# Patient Record
Sex: Male | Born: 1950 | Race: Black or African American | Hispanic: No | Marital: Single | Smoking: Never smoker
Health system: Southern US, Community
[De-identification: ages and names within clinical notes are randomized; demographics above are authoritative.]

## PROBLEM LIST (undated history)

## (undated) DIAGNOSIS — E785 Hyperlipidemia, unspecified: Secondary | ICD-10-CM

## (undated) DIAGNOSIS — E119 Type 2 diabetes mellitus without complications: Secondary | ICD-10-CM

## (undated) DIAGNOSIS — I10 Essential (primary) hypertension: Secondary | ICD-10-CM

## (undated) DIAGNOSIS — R000 Tachycardia, unspecified: Secondary | ICD-10-CM

## (undated) DIAGNOSIS — H409 Unspecified glaucoma: Secondary | ICD-10-CM

## (undated) HISTORY — DX: Tachycardia, unspecified: R00.0

## (undated) HISTORY — PX: TUMOR REMOVAL: SHX12

## (undated) HISTORY — DX: Unspecified glaucoma: H40.9

## (undated) HISTORY — DX: Essential (primary) hypertension: I10

---

## 1999-10-19 ENCOUNTER — Emergency Department (HOSPITAL_COMMUNITY): Admission: EM | Admit: 1999-10-19 | Discharge: 1999-10-19 | Payer: Self-pay | Admitting: Emergency Medicine

## 1999-10-19 ENCOUNTER — Encounter: Payer: Self-pay | Admitting: Emergency Medicine

## 1999-10-25 ENCOUNTER — Ambulatory Visit (HOSPITAL_COMMUNITY): Admission: RE | Admit: 1999-10-25 | Discharge: 1999-10-25 | Payer: Self-pay | Admitting: Cardiology

## 2000-04-27 ENCOUNTER — Ambulatory Visit (HOSPITAL_COMMUNITY): Admission: RE | Admit: 2000-04-27 | Discharge: 2000-04-27 | Payer: Self-pay | Admitting: Cardiology

## 2000-05-13 ENCOUNTER — Encounter: Payer: Self-pay | Admitting: Cardiology

## 2000-05-13 ENCOUNTER — Ambulatory Visit (HOSPITAL_COMMUNITY): Admission: RE | Admit: 2000-05-13 | Discharge: 2000-05-13 | Payer: Self-pay | Admitting: Cardiology

## 2006-01-16 ENCOUNTER — Encounter (HOSPITAL_COMMUNITY): Admission: RE | Admit: 2006-01-16 | Discharge: 2006-01-16 | Payer: Self-pay | Admitting: Cardiology

## 2010-04-13 ENCOUNTER — Encounter: Payer: Self-pay | Admitting: Cardiology

## 2010-11-15 ENCOUNTER — Other Ambulatory Visit (HOSPITAL_COMMUNITY): Payer: Self-pay | Admitting: Cardiology

## 2010-12-06 ENCOUNTER — Other Ambulatory Visit (HOSPITAL_COMMUNITY): Payer: Self-pay

## 2011-01-15 ENCOUNTER — Other Ambulatory Visit (HOSPITAL_COMMUNITY): Payer: Self-pay | Admitting: Cardiovascular Disease

## 2011-01-21 ENCOUNTER — Other Ambulatory Visit (HOSPITAL_COMMUNITY): Payer: Self-pay

## 2014-06-28 ENCOUNTER — Other Ambulatory Visit: Payer: Self-pay | Admitting: Nurse Practitioner

## 2014-06-28 ENCOUNTER — Encounter (INDEPENDENT_AMBULATORY_CARE_PROVIDER_SITE_OTHER): Payer: BC Managed Care – PPO

## 2014-06-28 ENCOUNTER — Encounter: Payer: Self-pay | Admitting: *Deleted

## 2014-06-28 DIAGNOSIS — R002 Palpitations: Secondary | ICD-10-CM

## 2014-06-28 DIAGNOSIS — R55 Syncope and collapse: Secondary | ICD-10-CM

## 2014-06-28 NOTE — Progress Notes (Signed)
Patient ID: Joseph Walker, male   DOB: [birth year], 64 y.o.   MRN: [id number] [employer] 48 hour holter monitor applied to patient.

## 2019-05-26 ENCOUNTER — Ambulatory Visit: Payer: BC Managed Care – PPO

## 2019-05-30 ENCOUNTER — Ambulatory Visit: Payer: BC Managed Care – PPO | Attending: Internal Medicine

## 2019-05-30 ENCOUNTER — Ambulatory Visit: Payer: BC Managed Care – PPO

## 2019-05-30 DIAGNOSIS — Z23 Encounter for immunization: Secondary | ICD-10-CM | POA: Insufficient documentation

## 2019-05-30 NOTE — Progress Notes (Signed)
   Covid-19 Vaccination Clinic  Name:  Joseph Walker    MRN: [id number] DOB: 08-05-1950  05/30/2019  [name] was observed post Covid-19 immunization for 15 minutes without incident. He was provided with Vaccine Information Sheet and instruction to access the V-Safe system.   [name] was instructed to call 911 with any severe reactions post vaccine: Marland Kitchen Difficulty breathing  . Swelling of face and throat  . A fast heartbeat  . A bad rash all over body  . Dizziness and weakness   Immunizations Administered    Name Date Dose VIS Date Route   Pfizer COVID-19 Vaccine 05/30/2019 12:21 PM 0.3 mL 03/04/2019 Intramuscular   Manufacturer: [employer], [employer]   Lot: [id number]   NDC: [address]-6

## 2019-07-05 ENCOUNTER — Ambulatory Visit: Payer: BC Managed Care – PPO | Attending: Internal Medicine

## 2019-07-05 DIAGNOSIS — Z23 Encounter for immunization: Secondary | ICD-10-CM

## 2019-07-05 NOTE — Progress Notes (Signed)
   Covid-19 Vaccination Clinic  Name:  Joseph Walker    MRN: [id number] DOB: 02-19-1951  07/05/2019  [name] was observed post Covid-19 immunization for 15 minutes without incident. He was provided with Vaccine Information Sheet and instruction to access the V-Safe system.   [name] was instructed to call 911 with any severe reactions post vaccine: Marland Kitchen Difficulty breathing  . Swelling of face and throat  . A fast heartbeat  . A bad rash all over body  . Dizziness and weakness   Immunizations Administered    Name Date Dose VIS Date Route   Pfizer COVID-19 Vaccine 07/05/2019 12:23 PM 0.3 mL 03/04/2019 Intramuscular   Manufacturer: [employer], [employer]   Lot: [id number]   NDC: [address]-0

## 2019-09-19 DIAGNOSIS — I10 Essential (primary) hypertension: Secondary | ICD-10-CM | POA: Diagnosis not present

## 2019-09-19 DIAGNOSIS — E119 Type 2 diabetes mellitus without complications: Secondary | ICD-10-CM | POA: Diagnosis not present

## 2019-09-19 DIAGNOSIS — I471 Supraventricular tachycardia: Secondary | ICD-10-CM | POA: Diagnosis not present

## 2019-09-19 DIAGNOSIS — E785 Hyperlipidemia, unspecified: Secondary | ICD-10-CM | POA: Diagnosis not present

## 2019-11-17 DIAGNOSIS — E119 Type 2 diabetes mellitus without complications: Secondary | ICD-10-CM | POA: Diagnosis not present

## 2019-11-30 DIAGNOSIS — H25813 Combined forms of age-related cataract, bilateral: Secondary | ICD-10-CM | POA: Diagnosis not present

## 2019-11-30 DIAGNOSIS — E119 Type 2 diabetes mellitus without complications: Secondary | ICD-10-CM | POA: Diagnosis not present

## 2019-11-30 DIAGNOSIS — H11043 Peripheral pterygium, stationary, bilateral: Secondary | ICD-10-CM | POA: Diagnosis not present

## 2019-11-30 DIAGNOSIS — H40013 Open angle with borderline findings, low risk, bilateral: Secondary | ICD-10-CM | POA: Diagnosis not present

## 2020-07-04 ENCOUNTER — Ambulatory Visit (INDEPENDENT_AMBULATORY_CARE_PROVIDER_SITE_OTHER): Payer: Medicare Other

## 2020-07-04 ENCOUNTER — Other Ambulatory Visit: Payer: Self-pay

## 2020-07-04 ENCOUNTER — Ambulatory Visit: Payer: BC Managed Care – PPO | Admitting: Internal Medicine

## 2020-07-04 ENCOUNTER — Encounter: Payer: Self-pay | Admitting: Internal Medicine

## 2020-07-04 VITALS — BP 128/70 | HR 73 | Temp 99.6°F | Ht 68.75 in | Wt 168.2 lb

## 2020-07-04 DIAGNOSIS — Z0001 Encounter for general adult medical examination with abnormal findings: Secondary | ICD-10-CM

## 2020-07-04 DIAGNOSIS — I10 Essential (primary) hypertension: Secondary | ICD-10-CM

## 2020-07-04 DIAGNOSIS — K2100 Gastro-esophageal reflux disease with esophagitis, without bleeding: Secondary | ICD-10-CM

## 2020-07-04 DIAGNOSIS — R0789 Other chest pain: Secondary | ICD-10-CM

## 2020-07-04 DIAGNOSIS — E785 Hyperlipidemia, unspecified: Secondary | ICD-10-CM

## 2020-07-04 DIAGNOSIS — E139 Other specified diabetes mellitus without complications: Secondary | ICD-10-CM

## 2020-07-04 DIAGNOSIS — R739 Hyperglycemia, unspecified: Secondary | ICD-10-CM

## 2020-07-04 DIAGNOSIS — Z1211 Encounter for screening for malignant neoplasm of colon: Secondary | ICD-10-CM | POA: Insufficient documentation

## 2020-07-04 LAB — CBC WITH DIFFERENTIAL/PLATELET
Basophils Absolute: 0 10*3/uL (ref 0.0–0.1)
Basophils Relative: 1 % (ref 0.0–3.0)
Eosinophils Absolute: 0.2 10*3/uL (ref 0.0–0.7)
Eosinophils Relative: 3.7 % (ref 0.0–5.0)
HCT: 45.6 % (ref 39.0–52.0)
Hemoglobin: 15 g/dL (ref 13.0–17.0)
Lymphocytes Relative: 41.7 % (ref 12.0–46.0)
Lymphs Abs: 2 10*3/uL (ref 0.7–4.0)
MCHC: 32.9 g/dL (ref 30.0–36.0)
MCV: 78.1 fl (ref 78.0–100.0)
Monocytes Absolute: 0.5 10*3/uL (ref 0.1–1.0)
Monocytes Relative: 10.7 % (ref 3.0–12.0)
Neutro Abs: 2.1 10*3/uL (ref 1.4–7.7)
Neutrophils Relative %: 42.9 % — ABNORMAL LOW (ref 43.0–77.0)
Platelets: 220 10*3/uL (ref 150.0–400.0)
RBC: 5.84 Mil/uL — ABNORMAL HIGH (ref 4.22–5.81)
RDW: 13.8 % (ref 11.5–15.5)
WBC: 4.9 10*3/uL (ref 4.0–10.5)

## 2020-07-04 LAB — LIPID PANEL
Cholesterol: 231 mg/dL — ABNORMAL HIGH (ref 0–200)
HDL: 53.3 mg/dL (ref >39.00)
NonHDL: 177.21
Total CHOL/HDL Ratio: 4
Triglycerides: 321 mg/dL — ABNORMAL HIGH (ref 0.0–149.0)
VLDL: 64.2 mg/dL — ABNORMAL HIGH (ref 0.0–40.0)

## 2020-07-04 LAB — BASIC METABOLIC PANEL
BUN: 12 mg/dL (ref 6–23)
CO2: 27 mEq/L (ref 19–32)
Calcium: 9.8 mg/dL (ref 8.4–10.5)
Chloride: 101 mEq/L (ref 96–112)
Creatinine, Ser: 1.04 mg/dL (ref 0.40–1.50)
GFR: 73.75 mL/min (ref >60.00)
Glucose, Bld: 177 mg/dL — ABNORMAL HIGH (ref 70–99)
Potassium: 3.6 mEq/L (ref 3.5–5.1)
Sodium: 138 mEq/L (ref 135–145)

## 2020-07-04 LAB — HEPATIC FUNCTION PANEL
ALT: 12 U/L (ref 0–53)
AST: 18 U/L (ref 0–37)
Albumin: 4.2 g/dL (ref 3.5–5.2)
Alkaline Phosphatase: 68 U/L (ref 39–117)
Bilirubin, Direct: 0.1 mg/dL (ref 0.0–0.3)
Total Bilirubin: 0.7 mg/dL (ref 0.2–1.2)
Total Protein: 7.6 g/dL (ref 6.0–8.3)

## 2020-07-04 LAB — LDL CHOLESTEROL, DIRECT: Direct LDL: 140 mg/dL

## 2020-07-04 LAB — HEMOGLOBIN A1C: Hgb A1c MFr Bld: 9.1 % — ABNORMAL HIGH (ref 4.6–6.5)

## 2020-07-04 LAB — PSA: PSA: 2.25 ng/mL (ref 0.10–4.00)

## 2020-07-04 MED ORDER — ROSUVASTATIN CALCIUM 20 MG PO TABS: 20.0000 mg | ORAL_TABLET | Freq: Every day | ORAL | 1 refills | Status: DC

## 2020-07-04 MED ORDER — TOUJEO MAX SOLOSTAR 300 UNIT/ML ~~LOC~~ SOPN: 30.0000 [IU] | PEN_INJECTOR | Freq: Every day | SUBCUTANEOUS | 1 refills | Status: DC

## 2020-07-04 MED ORDER — INSULIN PEN NEEDLE 32G X 6 MM MISC: 1.0000 | Freq: Every day | 1 refills | Status: DC

## 2020-07-04 MED ORDER — FREESTYLE LIBRE 2 SENSOR MISC: 1.0000 | Freq: Every day | 5 refills | Status: DC

## 2020-07-04 MED ORDER — FREESTYLE LIBRE 2 READER DEVI: 1.0000 | Freq: Every day | 5 refills | Status: DC

## 2020-07-04 NOTE — Progress Notes (Signed)
Subjective:  Patient ID: Joseph Walker, male    DOB: 07-Aug-1950  Age: 70 y.o. MRN: [id number]  CC: New Patient (Initial Visit) (Check-up) and Annual Exam  This visit occurred during the SARS-CoV-2 public health emergency.  Safety protocols were in place, including screening questions prior to the visit, additional usage of staff PPE, and extensive cleaning of exam room while observing appropriate contact time as indicated for disinfecting solutions.    HPI QUARON DELACRUZ presents for a CPX and to establish.  He complains of a 60-month history of pain over his left anterior chest wall area.  He said he only feels the pain when he is in the bed.  He describes it as an achy sensation that wakes him up intermittently.  He denies cough, dyspnea on exertion, diaphoresis, dizziness, or lightheadedness.  He controls the pain with aspirin and Alka-Seltzer.  History Delynn has a past medical history of Glaucoma, Hypertension, and Tachycardia.   He has a past surgical history that includes Tumor removal (Left).   His family history includes Alzheimer's disease in his maternal grandfather, maternal grandmother, and mother; Cataracts in his mother; Glaucoma in his mother; Lung disease in his father.He reports that he has never smoked. He has never used smokeless tobacco. He reports current alcohol use of about 2.0 standard drinks of alcohol per week. He reports that he does not use drugs.  Outpatient Medications Prior to Visit  Medication Sig Dispense Refill  . aspirin 325 MG tablet Take 325 mg by mouth daily.    . Aspirin Effervescent (ALKA-SELTZER ORIGINAL PO) Take by mouth.    . diltiazem (DILTIAZEM CD) 240 MG 24 hr capsule Take 240 mg by mouth daily.    . hydrochlorothiazide (HYDRODIURIL) 12.5 MG tablet Take 12.5 mg by mouth daily.    Marland Kitchen losartan (COZAAR) 100 MG tablet Take 100 mg by mouth daily.    . Metoprolol Succinate 25 MG CS24 Take by mouth.     No facility-administered medications prior to  visit.    ROS Review of Systems  Constitutional: Negative for appetite change, chills, diaphoresis, fatigue and fever.  HENT: Negative.  Negative for trouble swallowing and voice change.   Eyes: Negative.   Respiratory: Negative for cough, choking, chest tightness, shortness of breath, wheezing and stridor.   Cardiovascular: Positive for chest pain. Negative for palpitations and leg swelling.  Gastrointestinal: Negative for abdominal pain, blood in stool, constipation, diarrhea, nausea and vomiting.  Endocrine: Negative.  Negative for cold intolerance, heat intolerance, polydipsia, polyphagia and polyuria.  Genitourinary: Negative.  Negative for difficulty urinating, scrotal swelling and testicular pain.  Musculoskeletal: Negative.  Negative for arthralgias, joint swelling and myalgias.  Skin: Negative.  Negative for color change.  Neurological: Negative.  Negative for dizziness, weakness, light-headedness, numbness and headaches.  Hematological: Negative for adenopathy. Does not bruise/bleed easily.  Psychiatric/Behavioral: Negative.     Objective:  BP 128/70 (BP Location: Right Arm, Patient Position: Sitting, Cuff Size: Large)   Pulse 73   Temp 99.6 F (37.6 C) (Oral)   Ht 5' 8.75" (1.746 m)   Wt 168 lb 3.2 oz (76.3 kg)   SpO2 98%   BMI 25.02 kg/m   Physical Exam Vitals reviewed.  Constitutional:      Appearance: Normal appearance.  HENT:     Nose: Nose normal.     Mouth/Throat:     Mouth: Mucous membranes are moist.  Eyes:     General: No scleral icterus.    Conjunctiva/sclera: Conjunctivae  normal.  Cardiovascular:     Rate and Rhythm: Normal rate and regular rhythm.     Heart sounds: No murmur heard. No friction rub. No gallop.   Pulmonary:     Effort: Pulmonary effort is normal.     Breath sounds: No stridor. No wheezing, rhonchi or rales.  Chest:     Chest wall: No mass, deformity, swelling or tenderness.  Abdominal:     General: Abdomen is flat. Bowel sounds  are normal. There is no distension.     Palpations: Abdomen is soft. There is no hepatomegaly, splenomegaly or mass.     Tenderness: There is no abdominal tenderness.     Hernia: There is no hernia in the left inguinal area or right inguinal area.  Genitourinary:    Pubic Area: No rash.      Penis: Normal and uncircumcised. No discharge, swelling or lesions.      Testes: Normal.        Right: Mass or tenderness not present.        Left: Mass or tenderness not present.     Epididymis:     Right: Normal.     Left: Normal.     Comments: He refused a DRE Musculoskeletal:        General: Normal range of motion.     Cervical back: Neck supple.     Right lower leg: No edema.     Left lower leg: No edema.  Lymphadenopathy:     Cervical: No cervical adenopathy.     Lower Body: No right inguinal adenopathy. No left inguinal adenopathy.  Skin:    General: Skin is warm and dry.     Coloration: Skin is not pale.  Neurological:     General: No focal deficit present.     Mental Status: He is alert and oriented to person, place, and time. Mental status is at baseline.  Psychiatric:        Mood and Affect: Mood normal.        Behavior: Behavior normal.      Lab Results  Component Value Date   WBC 4.9 07/04/2020   HGB 15.0 07/04/2020   HCT 45.6 07/04/2020   PLT 220.0 07/04/2020   GLUCOSE 177 (H) 07/04/2020   CHOL 231 (H) 07/04/2020   TRIG 321.0 (H) 07/04/2020   HDL 53.30 07/04/2020   LDLDIRECT 140.0 07/04/2020   ALT 12 07/04/2020   AST 18 07/04/2020   NA 138 07/04/2020   K 3.6 07/04/2020   CL 101 07/04/2020   CREATININE 1.04 07/04/2020   BUN 12 07/04/2020   CO2 27 07/04/2020   PSA 2.25 07/04/2020   HGBA1C 9.1 (H) 07/04/2020   No results found.  Assessment & Plan:   [place] was seen today for new patient (initial visit) and annual exam.  Diagnoses and all orders for this visit:  Primary hypertension- His blood pressure is adequately well controlled. -     CBC with  Differential/Platelet; Future -     Basic metabolic panel; Future -     Hepatic function panel; Future -     Hepatic function panel -     Basic metabolic panel -     CBC with Differential/Platelet  Encounter for general adult medical examination with abnormal findings- Exam completed, labs reviewed, he refused all vaccines, cancer screenings addressed, patient education material was given. -     Lipid panel; Future -     HIV Antibody (routine testing w rflx); Future -  Hepatitis C antibody; Future -     PSA; Future -     PSA -     Hepatitis C antibody -     HIV Antibody (routine testing w rflx) -     Lipid panel  Left-sided chest wall pain- His chest pain is noncardiac in nature.  He is however high risk for cardiovascular disease so I recommended that he undergo CT cardiac calcium score.  His chest x-ray and exam are reassuring.  I will treat for GERD.  I have asked him to stop taking Alka-Seltzer. -     DG Chest 2 View; Future  Hyperglycemia- His A1c is at 9.1%.  Will treat him as type 1.5 diabetic starting with basal insulin. -     Basic metabolic panel; Future -     Hemoglobin A1c; Future -     Hemoglobin A1c -     Basic metabolic panel  Colon cancer screening -     Ambulatory referral to Gastroenterology  Diabetes 1.5, managed as type 1 (HCC) -     Continuous Blood Gluc Sensor (FREESTYLE LIBRE 2 SENSOR) MISC; 1 Act by Does not apply route daily. -     Continuous Blood Gluc Receiver (FREESTYLE LIBRE 2 READER) DEVI; 1 Act by Does not apply route daily. -     Ambulatory referral to Ophthalmology -     Amb Referral to Nutrition and Diabetic Education -     Ambulatory referral to Endocrinology -     insulin glargine, 2 Unit Dial, (TOUJEO MAX SOLOSTAR) 300 UNIT/ML Solostar Pen; Inject 30 Units into the skin daily. -     Insulin Pen Needle 32G X 6 MM MISC; 1 Act by Does not apply route daily. -     HM Diabetes Foot Exam  Hyperlipidemia LDL goal <70- I have asked him to take a  statin for cardiovascular risk reduction. -     rosuvastatin (CRESTOR) 20 MG tablet; Take 1 tablet (20 mg total) by mouth daily.  Other orders -     LDL cholesterol, direct   I am having Adan B. Michelle PiperGuy start on [employer] CompanyFreeStyle Libre 2 Sensor, [employer] ResourcesFreeStyle Libre 2 Reader, PACCAR Incoujeo Max SoloStar, Insulin Pen Needle, rosuvastatin, and dexlansoprazole. I am also having him maintain his diltiazem, losartan, hydrochlorothiazide, Metoprolol Succinate, aspirin, and Aspirin Effervescent (ALKA-SELTZER ORIGINAL PO).  Meds ordered this encounter  Medications  . Continuous Blood Gluc Sensor (FREESTYLE LIBRE 2 SENSOR) MISC    Sig: 1 Act by Does not apply route daily.    Dispense:  2 each    Refill:  5  . Continuous Blood Gluc Receiver (FREESTYLE LIBRE 2 READER) DEVI    Sig: 1 Act by Does not apply route daily.    Dispense:  2 each    Refill:  5  . insulin glargine, 2 Unit Dial, (TOUJEO MAX SOLOSTAR) 300 UNIT/ML Solostar Pen    Sig: Inject 30 Units into the skin daily.    Dispense:  9 mL    Refill:  1  . Insulin Pen Needle 32G X 6 MM MISC    Sig: 1 Act by Does not apply route daily.    Dispense:  100 each    Refill:  1  . rosuvastatin (CRESTOR) 20 MG tablet    Sig: Take 1 tablet (20 mg total) by mouth daily.    Dispense:  90 tablet    Refill:  1  . dexlansoprazole (DEXILANT) 60 MG capsule    Sig: Take 1 capsule (  60 mg total) by mouth daily.    Dispense:  90 capsule    Refill:  1     Follow-up: Return in about 3 months (around 10/03/2020).  Sanda Linger, MD

## 2020-07-04 NOTE — Patient Instructions (Signed)

## 2020-07-05 ENCOUNTER — Encounter: Payer: Self-pay | Admitting: Internal Medicine

## 2020-07-05 LAB — HIV ANTIBODY (ROUTINE TESTING W REFLEX): HIV 1&2 Ab, 4th Generation: NONREACTIVE

## 2020-07-05 LAB — HEPATITIS C ANTIBODY
Hepatitis C Ab: NONREACTIVE
SIGNAL TO CUT-OFF: 0.02 (ref <1.00)

## 2020-07-07 DIAGNOSIS — K2100 Gastro-esophageal reflux disease with esophagitis, without bleeding: Secondary | ICD-10-CM | POA: Insufficient documentation

## 2020-07-07 DIAGNOSIS — E785 Hyperlipidemia, unspecified: Secondary | ICD-10-CM | POA: Insufficient documentation

## 2020-07-07 MED ORDER — DEXLANSOPRAZOLE 60 MG PO CPDR: 60.0000 mg | DELAYED_RELEASE_CAPSULE | Freq: Every day | ORAL | 1 refills | Status: DC

## 2020-07-07 MED ORDER — ASPIRIN 325 MG PO TABS: 325.0000 mg | ORAL_TABLET | Freq: Every day | ORAL | 1 refills | Status: DC

## 2020-07-17 ENCOUNTER — Encounter: Payer: Self-pay | Admitting: Internal Medicine

## 2020-07-23 ENCOUNTER — Encounter: Payer: Self-pay | Admitting: Endocrinology

## 2020-07-23 ENCOUNTER — Encounter: Payer: Self-pay | Admitting: Internal Medicine

## 2020-08-08 ENCOUNTER — Ambulatory Visit: Payer: BC Managed Care – PPO | Admitting: Nutrition

## 2020-08-14 ENCOUNTER — Other Ambulatory Visit: Payer: Self-pay

## 2020-08-14 ENCOUNTER — Encounter: Payer: Medicare Other | Attending: Internal Medicine | Admitting: Nutrition

## 2020-08-14 DIAGNOSIS — E139 Other specified diabetes mellitus without complications: Secondary | ICD-10-CM | POA: Insufficient documentation

## 2020-08-14 NOTE — Progress Notes (Signed)
Patient is here with his cousin to "learn how to eat".  He did not pick up his Josephine Igo, nor his insulin at the pharmacy in April. He reports having given up sweet drinks and fruit juices, and replaced this with water. Current diet is eating all meals out, and taking in large amounts of saturated fat with daily biscuits, hamburgers and small orders of fries.   Discussed the idea of balanced meals of all food groups: carbohydrates, protein and fat.  Discussed the importance of each food group, and what foods are in each group.  Gave written suggestions for lower fat food choices for eating out. Also discussed insulin, how it works and why the doctor feels he needs this.   He is not testing his blood sugars.  We discussed the importance of this, and to see if his dietary changes have helped his blood sugar readings.  He agreed to try the [place], and he was given a Libre 2 sample, and shown how/where to insert this. Lot #:[id number], exp. Date: 7/22.  His phone would not support the app, so we called his pharmacy, and they still had the script on file and he can go by there to pick it up.  He was told to come back here for me to link the sensor to the reader.  He called later, saying the reader would not be ready for pick up until after 5PM.  He was given the 800 number to call to help him with this.   I believe that if he sees how high his blood sugars are going after the meal, he may consider the insulin He was shown how to use the SoloStar pen and how to dial up the 30u, where to inject and how to remove the needle.  He reported good understanding of this. We also discussed low blood sugars--symptoms and treatment of this.  I strongly encouraged him to return in 2 weeks 2 review his blood sugar readings, but said he would call me.  They had no final questions.

## 2020-08-29 ENCOUNTER — Telehealth: Payer: Self-pay | Admitting: Nutrition

## 2020-08-29 NOTE — Telephone Encounter (Addendum)
Message left on voice mail to call me to discuss how he is doing with Josephine Igo and his diet.

## 2020-08-29 NOTE — Patient Instructions (Addendum)
Call Josephine Igo help line to link the sensor to the reader Scan reader before meals and at bedtime Take 30u of Toujeo daily Call me in 2 weeks:  [phone].

## 2020-08-30 NOTE — Telephone Encounter (Signed)
Patient called and stated that his Joseph Walker was done and he needs help putting a new one on. Advised him that he has a prescription at his pharmacy and he needs to pick this up.   Appointment made for 08/31/2020 at 4:00 to help him put on and start a new sensor.  Patient stated that since his talk with Bonita Quin he has not been using sugar, regular soda, juice, or heavy foods.  He reports not liking to take his insulin. States that sensor readings have been between 142-192 and has been scanning sensor before and after each meal.  Breakfast:  grits and eggs and blueberry muffin or oatmeal Dinner: chilli  Will inquire more about inulin consistency when he comes for his appointment tomorrow. He is difficult to understand over the phone. He states that he is stressed, does not understand technology, is having problems with his vision as well as his memory.  Oran Rein, RD, LDN, CDCES

## 2020-08-31 ENCOUNTER — Encounter: Payer: Medicare Other | Attending: Internal Medicine | Admitting: Dietician

## 2020-08-31 ENCOUNTER — Other Ambulatory Visit: Payer: Self-pay

## 2020-08-31 DIAGNOSIS — E139 Other specified diabetes mellitus without complications: Secondary | ICD-10-CM | POA: Diagnosis not present

## 2020-08-31 NOTE — Progress Notes (Signed)
Patient is here today with his cousin.  He needs assistance changing his FreeStyle Libre 2. He brought his blood glucose log and has not started the Toujeo yet.  Patient was able to apply and start a new FreeSTyle Libre sensor with guidance.  Instructed patient again how to use the [employer], dial up to 30 units and inject (holding for 6-10 seconds after injection).  He states that he plans to start with 10 units of Toujeo. Patient verbalized a strong fear of needles.  He declined doing a practice injection in the office. He stated that he has a friend with Type 1 diabetes who will help him if needed.    Reviewed blood glucose goals and provided a handout with this information. Patient verbalized how to treat a low blood glucose. Appointment made with Bonita Quin, RD, CDCES for further educastion and follow up on 6/21/202.  Left the sensor glucose record with Bonita Quin as well to call him next week.  Oran Rein, RD, LDN, CDCES

## 2020-08-31 NOTE — Patient Instructions (Signed)
Begin taking your insulin daily. Follow up for any concerns.

## 2020-09-11 ENCOUNTER — Encounter: Payer: Medicare Other | Admitting: Nutrition

## 2020-09-11 ENCOUNTER — Other Ambulatory Visit: Payer: Self-pay

## 2020-09-11 DIAGNOSIS — E139 Other specified diabetes mellitus without complications: Secondary | ICD-10-CM | POA: Diagnosis not present

## 2020-09-11 NOTE — Progress Notes (Addendum)
Patient reports that he not drinking sweet drinks, nor eating deserts.  Will have 4-6 grapes for a snack after supper, and in the late afternoon 2 hrs., before supper.  His breakfast is oatmeal and lunch is a sandwich with nothing else.  Grapes in the afternoon.   Says he has lost 5 pounds and wants to loose 5 more. Is walking 20-30 min., or working outside, 4-5 days/wk.   He also reports that his vision has improved, "things are now more in focus and able to read".   Taking 12u of insulin q HS.  Says this was the hardest thing to do, but "it is not so bad now!.  Injecting into the left abdomen, but not rotating sites.  Stressed need to use right side of abdomen and gave him other areas to try as well.  He agreed to move the site around his abdominal area, but no other places.   He changed out his Josephine Igo himself, with no help from me.  FBSs: last 5 days: 121, 16, 115, 112, 110.  acL: 135, 2hr. pcL: 135.  acS: 130, 2hr pcS: 162.   Plan:  Continue on same dose until next doctor appointment.  If 2hr. Pc readings go higher than 170, call MD.

## 2020-09-11 NOTE — Telephone Encounter (Signed)
Appointment scheduled to see him on 6/21.

## 2020-09-11 NOTE — Patient Instructions (Signed)
Continue taking 12u of insulin at bedtime. Continue exercising. Call if questions

## 2020-12-20 DIAGNOSIS — I10 Essential (primary) hypertension: Secondary | ICD-10-CM | POA: Diagnosis not present

## 2020-12-20 DIAGNOSIS — I471 Supraventricular tachycardia: Secondary | ICD-10-CM | POA: Diagnosis not present

## 2020-12-20 DIAGNOSIS — E119 Type 2 diabetes mellitus without complications: Secondary | ICD-10-CM | POA: Diagnosis not present

## 2020-12-20 DIAGNOSIS — E785 Hyperlipidemia, unspecified: Secondary | ICD-10-CM | POA: Diagnosis not present

## 2021-03-11 DIAGNOSIS — I10 Essential (primary) hypertension: Secondary | ICD-10-CM | POA: Diagnosis not present

## 2021-03-11 DIAGNOSIS — R0789 Other chest pain: Secondary | ICD-10-CM | POA: Diagnosis not present

## 2021-03-11 DIAGNOSIS — E785 Hyperlipidemia, unspecified: Secondary | ICD-10-CM | POA: Diagnosis not present

## 2021-04-09 ENCOUNTER — Other Ambulatory Visit: Payer: Self-pay | Admitting: Internal Medicine

## 2021-04-09 DIAGNOSIS — E139 Other specified diabetes mellitus without complications: Secondary | ICD-10-CM

## 2021-04-16 ENCOUNTER — Telehealth: Payer: Self-pay

## 2021-04-16 NOTE — Telephone Encounter (Signed)
Pt is calling for Continuous Blood Gluc Sensor (FREESTYLE LIBRE 2 SENSOR) MISC stating that the pharmacy said that the Rx has to be release.  Pharmacy: [facility] [phone] - [place], [place] - [address]  LOV: 07/04/20  Pt F/U for 04/30/21

## 2021-04-17 NOTE — Telephone Encounter (Signed)
I have reached out to the pharmacy to inquire about what is needed for the Rx. Pharmacy rep stated that the [place] sensors needed a PA.    I also received the following drug insurance info from the pharmacy to initiate PA as listed below:  BIN [id number] PCN [id number] RxGroup [id number] ID# [id number]   Pt has been informed that PA has been initiated.

## 2021-04-17 NOTE — Telephone Encounter (Signed)
Key: [id number]

## 2021-04-17 NOTE — Telephone Encounter (Signed)
Pt calling in to get an update on the Continuous Blood Gluc Sensor (FREESTYLE LIBRE 2 SENSOR) MISC .  Pharmacy: [facility] [phone] - [place], [place] - [address]   LOV: 07/04/20   Pt F/U for 04/30/21

## 2021-04-30 ENCOUNTER — Ambulatory Visit: Payer: BC Managed Care – PPO | Admitting: Internal Medicine

## 2021-05-02 ENCOUNTER — Ambulatory Visit
Admission: EM | Admit: 2021-05-02 | Discharge: 2021-05-02 | Disposition: A | Discharge status: Home / Self Care | Payer: Medicare PPO | Attending: Physician Assistant | Admitting: Physician Assistant

## 2021-05-02 ENCOUNTER — Encounter: Payer: Self-pay | Admitting: Emergency Medicine

## 2021-05-02 ENCOUNTER — Other Ambulatory Visit: Payer: Self-pay

## 2021-05-02 DIAGNOSIS — M542 Cervicalgia: Secondary | ICD-10-CM | POA: Diagnosis not present

## 2021-05-02 HISTORY — DX: Hyperlipidemia, unspecified: E78.5

## 2021-05-02 HISTORY — DX: Type 2 diabetes mellitus without complications: E11.9

## 2021-05-02 MED ORDER — PREDNISONE 20 MG PO TABS: 20.0000 mg | ORAL_TABLET | Freq: Every day | ORAL | 0 refills | Status: AC

## 2021-05-02 MED ORDER — CYCLOBENZAPRINE HCL 10 MG PO TABS: 10.0000 mg | ORAL_TABLET | Freq: Two times a day (BID) | ORAL | 0 refills | Status: DC | PRN

## 2021-05-02 NOTE — ED Provider Notes (Signed)
[facility]    CSN: [id number] Arrival date & time: 05/02/21  1527      History   Chief Complaint Chief Complaint  Patient presents with   Headache    HPI Joseph Walker is a 71 y.o. male.   Patient here today for evaluation of left sided neck pain that radiates into left side of scalp behind left ear. He reports pain has been present for the last month. He has not had any numbness or tingling.  He has tried heat, massage, and this helps somewhat but then symptoms return.  He denies any dizziness, nausea, vomiting, slurred speech or weakness.  The history is provided by the patient.  Headache Associated symptoms: no dizziness, no fever, no nausea, no numbness, no vomiting and no weakness    Past Medical History:  Diagnosis Date   Diabetes mellitus without complication (HCC)    Glaucoma    Hyperlipemia    Hypertension    Tachycardia     Patient Active Problem List   Diagnosis Date Noted   Gastroesophageal reflux disease with esophagitis without hemorrhage 07/07/2020   Hyperlipidemia LDL goal <70 07/07/2020   Colon cancer screening 07/04/2020   Primary hypertension 07/04/2020   Left-sided chest wall pain 07/04/2020   Hyperglycemia 07/04/2020   Diabetes 1.5, managed as type 1 (HCC) 07/04/2020    Past Surgical History:  Procedure Laterality Date   TUMOR REMOVAL Left    Left Breast       Home Medications    Prior to Admission medications   Medication Sig Start Date End Date Taking? Authorizing Provider  cyclobenzaprine (FLEXERIL) 10 MG tablet Take 1 tablet (10 mg total) by mouth 2 (two) times daily as needed for muscle spasms. 05/02/21  Yes Tomi Bamberger, PA-C  predniSONE (DELTASONE) 20 MG tablet Take 1 tablet (20 mg total) by mouth daily with breakfast for 5 days. 05/02/21 05/07/21 Yes Tomi Bamberger, PA-C  aspirin 325 MG tablet Take 1 tablet (325 mg total) by mouth daily. 07/07/20   Etta Grandchild, MD  Continuous Blood Gluc Receiver (FREESTYLE  LIBRE 2 READER) DEVI 1 Act by Does not apply route daily. 07/04/20   Etta Grandchild, MD  Continuous Blood Gluc Sensor (FREESTYLE LIBRE 2 SENSOR) MISC USE DAILY 04/09/21   Etta Grandchild, MD  dexlansoprazole (DEXILANT) 60 MG capsule Take 1 capsule (60 mg total) by mouth daily. 07/07/20   Etta Grandchild, MD  diltiazem (DILTIAZEM CD) 240 MG 24 hr capsule Take 240 mg by mouth daily.    [provider]  hydrochlorothiazide (HYDRODIURIL) 12.5 MG tablet Take 12.5 mg by mouth daily.    [provider]  insulin glargine, 2 Unit Dial, (TOUJEO MAX SOLOSTAR) 300 UNIT/ML Solostar Pen Inject 30 Units into the skin daily. 07/04/20   Etta Grandchild, MD  Insulin Pen Needle 32G X 6 MM MISC 1 Act by Does not apply route daily. 07/04/20   Etta Grandchild, MD  losartan (COZAAR) 100 MG tablet Take 100 mg by mouth daily.    [provider]  Metoprolol Succinate 25 MG CS24 Take by mouth.    [provider]  rosuvastatin (CRESTOR) 20 MG tablet Take 1 tablet (20 mg total) by mouth daily. 07/04/20   Etta Grandchild, MD    Family History Family History  Problem Relation Age of Onset   Glaucoma Mother    Cataracts Mother    Alzheimer's disease Mother    Lung disease Father  Alzheimer's disease Maternal Grandmother    Alzheimer's disease Maternal Grandfather     Social History Social History   Tobacco Use   Smoking status: Never   Smokeless tobacco: Never  Vaping Use   Vaping Use: Never used  Substance Use Topics   Alcohol use: Yes    Alcohol/week: 2.0 standard drinks    Types: 2 Cans of beer per week   Drug use: Never     Allergies   Patient has no known allergies.   Review of Systems Review of Systems  Constitutional:  Negative for chills and fever.  Eyes:  Negative for discharge and redness.  Gastrointestinal:  Negative for nausea and vomiting.  Neurological:  Positive for headaches. Negative for dizziness, speech difficulty, weakness, light-headedness and  numbness.    Physical Exam Triage Vital Signs ED Triage Vitals  Enc Vitals Group     BP 05/02/21 1612 (!) 159/87     Pulse Rate 05/02/21 1612 76     Resp 05/02/21 1612 16     Temp 05/02/21 1612 98.2 F (36.8 C)     Temp Source 05/02/21 1612 Oral     SpO2 05/02/21 1612 98 %     Weight --      Height --      Head Circumference --      Peak Flow --      Pain Score 05/02/21 1613 2     Pain Loc --      Pain Edu? --      Excl. in GC? --    No data found.  Updated Vital Signs BP (!) 159/87 (BP Location: Left Arm)    Pulse 76    Temp 98.2 F (36.8 C) (Oral)    Resp 16    SpO2 98%      Physical Exam Vitals and nursing note reviewed.  Constitutional:      General: He is not in acute distress.    Appearance: Normal appearance. He is not ill-appearing.  HENT:     Head: Normocephalic and atraumatic.  Eyes:     Conjunctiva/sclera: Conjunctivae normal.  Neck:     Comments: Mild TTP noted to left lateral neck with suspected spasm noted Cardiovascular:     Rate and Rhythm: Normal rate.  Pulmonary:     Effort: Pulmonary effort is normal.  Neurological:     Mental Status: He is alert.  Psychiatric:        Mood and Affect: Mood normal.        Behavior: Behavior normal.        Thought Content: Thought content normal.     UC Treatments / Results  Labs (all labs ordered are listed, but only abnormal results are displayed) Labs Reviewed - No data to display  EKG   Radiology No results found.  Procedures Procedures (including critical care time)  Medications Ordered in UC Medications - No data to display  Initial Impression / Assessment and Plan / UC Course  I have reviewed the triage vital signs and the nursing notes.  Pertinent labs & imaging results that were available during my care of the patient were reviewed by me and considered in my medical decision making (see chart for details).    We will trial steroid and muscle relaxer for suspected muscle strain.   Encouraged follow-up if symptoms fail to improve or worsen anyway.  Patient expresses understanding.  Final Clinical Impressions(s) / UC Diagnoses   Final diagnoses:  Neck pain  Discharge Instructions   None    ED Prescriptions     Medication Sig Dispense Auth. Provider   predniSONE (DELTASONE) 20 MG tablet Take 1 tablet (20 mg total) by mouth daily with breakfast for 5 days. 5 tablet Erma Pinto F, PA-C   cyclobenzaprine (FLEXERIL) 10 MG tablet Take 1 tablet (10 mg total) by mouth 2 (two) times daily as needed for muscle spasms. 20 tablet Tomi Bamberger, PA-C      PDMP not reviewed this encounter.   Tomi Bamberger, PA-C 05/02/21 2029

## 2021-05-02 NOTE — ED Triage Notes (Signed)
Says over the last month he's had what feels like a pulled muscle that stretches from the back of his head into the back of his left neck. Waxes and wanes. Denies dizzy, nausea, vomiting, slurred speech, difficulty with gait

## 2021-05-14 DIAGNOSIS — E1169 Type 2 diabetes mellitus with other specified complication: Secondary | ICD-10-CM | POA: Diagnosis not present

## 2021-05-14 DIAGNOSIS — I471 Supraventricular tachycardia: Secondary | ICD-10-CM | POA: Diagnosis not present

## 2021-05-14 DIAGNOSIS — I10 Essential (primary) hypertension: Secondary | ICD-10-CM | POA: Diagnosis not present

## 2021-06-10 ENCOUNTER — Other Ambulatory Visit: Payer: Self-pay | Admitting: Internal Medicine

## 2021-06-10 DIAGNOSIS — E139 Other specified diabetes mellitus without complications: Secondary | ICD-10-CM

## 2021-06-20 ENCOUNTER — Encounter: Payer: Self-pay | Admitting: Internal Medicine

## 2021-06-20 ENCOUNTER — Ambulatory Visit (INDEPENDENT_AMBULATORY_CARE_PROVIDER_SITE_OTHER): Payer: Medicare Other | Admitting: Internal Medicine

## 2021-06-20 VITALS — BP 130/82 | HR 70 | Temp 98.5°F | Resp 16 | Ht 68.75 in | Wt 173.0 lb

## 2021-06-20 DIAGNOSIS — K2100 Gastro-esophageal reflux disease with esophagitis, without bleeding: Secondary | ICD-10-CM

## 2021-06-20 DIAGNOSIS — Z1211 Encounter for screening for malignant neoplasm of colon: Secondary | ICD-10-CM | POA: Diagnosis not present

## 2021-06-20 DIAGNOSIS — R778 Other specified abnormalities of plasma proteins: Secondary | ICD-10-CM

## 2021-06-20 DIAGNOSIS — I10 Essential (primary) hypertension: Secondary | ICD-10-CM

## 2021-06-20 DIAGNOSIS — E139 Other specified diabetes mellitus without complications: Secondary | ICD-10-CM

## 2021-06-20 DIAGNOSIS — N182 Chronic kidney disease, stage 2 (mild): Secondary | ICD-10-CM

## 2021-06-20 DIAGNOSIS — E785 Hyperlipidemia, unspecified: Secondary | ICD-10-CM | POA: Diagnosis not present

## 2021-06-20 LAB — BASIC METABOLIC PANEL
BUN: 14 mg/dL (ref 6–23)
CO2: 27 mEq/L (ref 19–32)
Calcium: 10.6 mg/dL — ABNORMAL HIGH (ref 8.4–10.5)
Chloride: 97 mEq/L (ref 96–112)
Creatinine, Ser: 1.17 mg/dL (ref 0.40–1.50)
GFR: 63.6 mL/min (ref >60.00)
Glucose, Bld: 232 mg/dL — ABNORMAL HIGH (ref 70–99)
Potassium: 3.6 mEq/L (ref 3.5–5.1)
Sodium: 135 mEq/L (ref 135–145)

## 2021-06-20 LAB — LIPID PANEL
Cholesterol: 144 mg/dL (ref 0–200)
HDL: 47.8 mg/dL (ref >39.00)
NonHDL: 96.11
Total CHOL/HDL Ratio: 3
Triglycerides: 278 mg/dL — ABNORMAL HIGH (ref 0.0–149.0)
VLDL: 55.6 mg/dL — ABNORMAL HIGH (ref 0.0–40.0)

## 2021-06-20 LAB — URINALYSIS, ROUTINE W REFLEX MICROSCOPIC
Bilirubin Urine: NEGATIVE
Hgb urine dipstick: NEGATIVE
Ketones, ur: NEGATIVE
Leukocytes,Ua: NEGATIVE
Nitrite: NEGATIVE
RBC / HPF: NONE SEEN (ref 0–?)
Specific Gravity, Urine: 1.01 (ref 1.000–1.030)
Total Protein, Urine: NEGATIVE
Urine Glucose: >=1000 — AB
Urobilinogen, UA: 0.2 (ref 0.0–1.0)
pH: 6.5 (ref 5.0–8.0)

## 2021-06-20 LAB — HEPATIC FUNCTION PANEL
ALT: 19 U/L (ref 0–53)
AST: 22 U/L (ref 0–37)
Albumin: 4.8 g/dL (ref 3.5–5.2)
Alkaline Phosphatase: 67 U/L (ref 39–117)
Bilirubin, Direct: 0.2 mg/dL (ref 0.0–0.3)
Total Bilirubin: 0.7 mg/dL (ref 0.2–1.2)
Total Protein: 8.4 g/dL — ABNORMAL HIGH (ref 6.0–8.3)

## 2021-06-20 LAB — CBC WITH DIFFERENTIAL/PLATELET
Basophils Absolute: 0.1 10*3/uL (ref 0.0–0.1)
Basophils Relative: 1 % (ref 0.0–3.0)
Eosinophils Absolute: 0.2 10*3/uL (ref 0.0–0.7)
Eosinophils Relative: 3.4 % (ref 0.0–5.0)
HCT: 46.7 % (ref 39.0–52.0)
Hemoglobin: 15.6 g/dL (ref 13.0–17.0)
Lymphocytes Relative: 36.3 % (ref 12.0–46.0)
Lymphs Abs: 1.9 10*3/uL (ref 0.7–4.0)
MCHC: 33.5 g/dL (ref 30.0–36.0)
MCV: 78.3 fl (ref 78.0–100.0)
Monocytes Absolute: 0.6 10*3/uL (ref 0.1–1.0)
Monocytes Relative: 11.4 % (ref 3.0–12.0)
Neutro Abs: 2.5 10*3/uL (ref 1.4–7.7)
Neutrophils Relative %: 47.9 % (ref 43.0–77.0)
Platelets: 188 10*3/uL (ref 150.0–400.0)
RBC: 5.97 Mil/uL — ABNORMAL HIGH (ref 4.22–5.81)
RDW: 13.7 % (ref 11.5–15.5)
WBC: 5.1 10*3/uL (ref 4.0–10.5)

## 2021-06-20 LAB — MICROALBUMIN / CREATININE URINE RATIO
Creatinine,U: 77.4 mg/dL
Microalb Creat Ratio: 0.9 mg/g (ref 0.0–30.0)
Microalb, Ur: <0.7 mg/dL (ref 0.0–1.9)

## 2021-06-20 LAB — LDL CHOLESTEROL, DIRECT: Direct LDL: 65 mg/dL

## 2021-06-20 LAB — HEMOGLOBIN A1C: Hgb A1c MFr Bld: 9.1 % — ABNORMAL HIGH (ref 4.6–6.5)

## 2021-06-20 LAB — TSH: TSH: 2.43 u[IU]/mL (ref 0.35–5.50)

## 2021-06-20 MED ORDER — XIGDUO XR 10-1000 MG PO TB24: 1.0000 | ORAL_TABLET | Freq: Every day | ORAL | 1 refills | Status: DC

## 2021-06-20 MED ORDER — FREESTYLE LIBRE 2 READER DEVI: 1.0000 | Freq: Every day | 5 refills | Status: DC

## 2021-06-20 MED ORDER — ASPIRIN EC 81 MG PO TBEC: 81.0000 mg | DELAYED_RELEASE_TABLET | Freq: Every day | ORAL | 1 refills | Status: AC

## 2021-06-20 MED ORDER — TOUJEO MAX SOLOSTAR 300 UNIT/ML ~~LOC~~ SOPN: 50.0000 [IU] | PEN_INJECTOR | Freq: Every day | SUBCUTANEOUS | 1 refills | Status: DC

## 2021-06-20 MED ORDER — ROSUVASTATIN CALCIUM 20 MG PO TABS: 20.0000 mg | ORAL_TABLET | Freq: Every day | ORAL | 1 refills | Status: DC

## 2021-06-20 MED ORDER — FREESTYLE LIBRE 2 SENSOR MISC: 5 refills | Status: DC

## 2021-06-20 NOTE — Progress Notes (Signed)
? ?Subjective:  ?Patient ID: Joseph Walker, male    DOB: 08-18-1950  Age: 71 y.o. MRN: [id number] ? ?CC: Hypertension, Hyperlipidemia, and Diabetes ? ?This visit occurred during the SARS-CoV-2 public health emergency.  Safety protocols were in place, including screening questions prior to the visit, additional usage of staff PPE, and extensive cleaning of exam room while observing appropriate contact time as indicated for disinfecting solutions.   ? ?HPI ?Joseph Walker presents for f/up - ? ?He tells me that he saw a cardiologist 3 months ago and is being evaluated for palpitations.  Based on prescription refills he would no longer be taking medications that I prescribed.  He does not monitor his blood sugar but denies polys.  He is active and denies chest pain, shortness of breath, diaphoresis, dizziness, lightheadedness, or edema. ? ?Outpatient Medications Prior to Visit  ?Medication Sig Dispense Refill  ? dexlansoprazole (DEXILANT) 60 MG capsule Take 1 capsule (60 mg total) by mouth daily. 90 capsule 1  ? diltiazem (CARDIZEM CD) 240 MG 24 hr capsule Take 240 mg by mouth daily.    ? Insulin Pen Needle 32G X 6 MM MISC 1 Act by Does not apply route daily. 100 each 1  ? losartan (COZAAR) 100 MG tablet Take 100 mg by mouth daily.    ? Metoprolol Succinate 25 MG CS24 Take by mouth.    ? aspirin 325 MG tablet Take 1 tablet (325 mg total) by mouth daily. 90 tablet 1  ? Continuous Blood Gluc Receiver (FREESTYLE LIBRE 2 READER) DEVI 1 Act by Does not apply route daily. 2 each 5  ? Continuous Blood Gluc Sensor (FREESTYLE LIBRE 2 SENSOR) MISC USE DAILY 2 each 5  ? cyclobenzaprine (FLEXERIL) 10 MG tablet Take 1 tablet (10 mg total) by mouth 2 (two) times daily as needed for muscle spasms. 20 tablet 0  ? hydrochlorothiazide (HYDRODIURIL) 12.5 MG tablet Take 12.5 mg by mouth daily.    ? rosuvastatin (CRESTOR) 20 MG tablet Take 1 tablet (20 mg total) by mouth daily. 90 tablet 1  ? TOUJEO MAX SOLOSTAR 300 UNIT/ML Solostar Pen  ADMINISTER 30 UNITS UNDER THE SKIN DAILY 9 mL 1  ? ?No facility-administered medications prior to visit.  ? ? ?ROS ?Review of Systems  ?Constitutional:  Negative for appetite change, diaphoresis, fatigue and fever.  ?HENT: Negative.    ?Eyes: Negative.   ?Respiratory:  Negative for cough, chest tightness, shortness of breath and wheezing.   ?Cardiovascular:  Negative for chest pain, palpitations and leg swelling.  ?Gastrointestinal:  Negative for abdominal pain, constipation, diarrhea, nausea and vomiting.  ?Endocrine: Negative.  Negative for polydipsia, polyphagia and polyuria.  ?Genitourinary: Negative.  Negative for difficulty urinating and dysuria.  ?Musculoskeletal: Negative.  Negative for arthralgias and joint swelling.  ?Skin: Negative.  Negative for color change and rash.  ?Neurological: Negative.  Negative for dizziness, weakness, light-headedness and headaches.  ?Hematological:  Negative for adenopathy. Does not bruise/bleed easily.  ?Psychiatric/Behavioral: Negative.    ? ?Objective:  ?BP 130/82 (BP Location: Right Arm, Patient Position: Sitting, Cuff Size: Large)   Pulse 70   Temp 98.5 ?F (36.9 ?C) (Oral)   Resp 16   Ht 5' 8.75" (1.746 m)   Wt 173 lb (78.5 kg)   SpO2 96%   BMI 25.73 kg/m?  ? ?BP Readings from Last 3 Encounters:  ?06/20/21 130/82  ?05/02/21 (!) 159/87  ?07/04/20 128/70  ? ? ?Wt Readings from Last 3 Encounters:  ?06/20/21 173 lb (78.5 kg)  ?  09/11/20 166 lb 6 oz (75.5 kg)  ?07/04/20 168 lb 3.2 oz (76.3 kg)  ? ? ?Physical Exam ?Vitals reviewed.  ?Constitutional:   ?   Appearance: He is not ill-appearing.  ?HENT:  ?   Nose: Nose normal.  ?   Mouth/Throat:  ?   Mouth: Mucous membranes are moist.  ?Eyes:  ?   General: No scleral icterus. ?   Conjunctiva/sclera: Conjunctivae normal.  ?Cardiovascular:  ?   Rate and Rhythm: Normal rate and regular rhythm.  ?   Heart sounds: Murmur heard.  ?Systolic murmur is present with a grade of 1/6.  ?No diastolic murmur is present.  ?  No friction rub.  No gallop.  ?Pulmonary:  ?   Effort: Pulmonary effort is normal.  ?   Breath sounds: No stridor. No wheezing, rhonchi or rales.  ?Abdominal:  ?   General: Abdomen is flat.  ?   Palpations: There is no mass.  ?   Tenderness: There is no abdominal tenderness. There is no guarding.  ?   Hernia: No hernia is present.  ?Musculoskeletal:     ?   General: Normal range of motion.  ?   Cervical back: Neck supple.  ?   Right lower leg: No edema.  ?   Left lower leg: No edema.  ?Lymphadenopathy:  ?   Cervical: No cervical adenopathy.  ?Skin: ?   General: Skin is warm and dry.  ?Neurological:  ?   General: No focal deficit present.  ?   Mental Status: He is alert. Mental status is at baseline.  ?Psychiatric:     ?   Mood and Affect: Mood normal.     ?   Behavior: Behavior normal.  ? ? ?Lab Results  ?Component Value Date  ? WBC 5.1 06/20/2021  ? HGB 15.6 06/20/2021  ? HCT 46.7 06/20/2021  ? PLT 188.0 06/20/2021  ? GLUCOSE 232 (H) 06/20/2021  ? CHOL 144 06/20/2021  ? TRIG 278.0 (H) 06/20/2021  ? HDL 47.80 06/20/2021  ? LDLDIRECT 65.0 06/20/2021  ? ALT 19 06/20/2021  ? AST 22 06/20/2021  ? NA 135 06/20/2021  ? K 3.6 06/20/2021  ? CL 97 06/20/2021  ? CREATININE 1.17 06/20/2021  ? BUN 14 06/20/2021  ? CO2 27 06/20/2021  ? TSH 2.43 06/20/2021  ? PSA 2.25 07/04/2020  ? HGBA1C 9.1 (H) 06/20/2021  ? MICROALBUR <0.7 06/20/2021  ? ? ?No results found. ? ?Assessment & Plan:  ? ?Dart was seen today for hypertension, hyperlipidemia and diabetes. ? ?Diagnoses and all orders for this visit: ? ?Primary hypertension- His blood pressure is adequately well controlled.  He has mild hypercalcemia so I will be certain that he stops taking the thiazide diuretic. ?-     Basic metabolic panel; Future ?-     Urinalysis, Routine w reflex microscopic; Future ?-     TSH; Future ?-     Hepatic function panel; Future ?-     CBC with Differential/Platelet; Future ?-     CBC with Differential/Platelet ?-     Hepatic function panel ?-     TSH ?-      Urinalysis, Routine w reflex microscopic ?-     Basic metabolic panel ? ?Diabetes 1.5, managed as type 1 (HCC)- His A1c is too high at 9.1%.  Will increase the dose of the basal insulin and will start metformin and an SGLT2 inhibitor. ?-     Microalbumin / creatinine urine ratio; Future ?-  Basic metabolic panel; Future ?-     Urinalysis, Routine w reflex microscopic; Future ?-     Hemoglobin A1c; Future ?-     HM Diabetes Foot Exam ?-     Ambulatory referral to Ophthalmology ?-     Hemoglobin A1c ?-     Urinalysis, Routine w reflex microscopic ?-     Basic metabolic panel ?-     Microalbumin / creatinine urine ratio ?-     Continuous Blood Gluc Receiver (FREESTYLE LIBRE 2 READER) DEVI; 1 Act by Does not apply route daily. ?-     Continuous Blood Gluc Sensor (FREESTYLE LIBRE 2 SENSOR) MISC; USE DAILY ?-     Dapagliflozin-metFORMIN HCl ER (XIGDUO XR) 12-998 MG TB24; Take 1 tablet by mouth daily. ?-     insulin glargine, 2 Unit Dial, (TOUJEO MAX SOLOSTAR) 300 UNIT/ML Solostar Pen; Inject 50 Units into the skin daily. ? ?Gastroesophageal reflux disease with esophagitis without hemorrhage- His symptoms are well controlled. ?-     CBC with Differential/Platelet; Future ?-     CBC with Differential/Platelet ? ?Hyperlipidemia LDL goal <70- LDL goal achieved. Doing well on the statin  ?-     Lipid panel; Future ?-     TSH; Future ?-     Hepatic function panel; Future ?-     Hepatic function panel ?-     TSH ?-     Lipid panel ?-     aspirin EC 81 MG tablet; Take 1 tablet (81 mg total) by mouth daily. ?-     rosuvastatin (CRESTOR) 20 MG tablet; Take 1 tablet (20 mg total) by mouth daily. ? ?Colon cancer screening ?-     Cologuard ? ?Chronic renal disease, stage 2, mildly decreased glomerular filtration rate (GFR) between 60-89 mL/min/1.73 square meter- Will start dapagliflozin for renal protection. ?-     Dapagliflozin-metFORMIN HCl ER (XIGDUO XR) 12-998 MG TB24; Take 1 tablet by mouth daily. ? ?Hypercalcemia- Will  discontinue the thiazide diuretic and I have asked him to return to be evaluated for hyperparathyroidism. ? ?Elevated total protein- Will recheck this the next time I see him and will screen for lymphoproliferative disease.

## 2021-06-20 NOTE — Patient Instructions (Signed)
Type 2 Diabetes Mellitus, Diagnosis, Adult ?Type 2 diabetes (type 2 diabetes mellitus) is a long-term, or chronic, disease. In type 2 diabetes, one or both of these problems may be present: ?The pancreas does not make enough of a hormone called insulin. ?Cells in the body do not respond properly to the insulin that the body makes (insulin resistance). ?Normally, insulin allows blood sugar (glucose) to enter cells in the body. The cells use glucose for energy. Insulin resistance or lack of insulin causes excess glucose to build up in the blood instead of going into cells. This causes high blood glucose (hyperglycemia).  ?What are the causes? ?The exact cause of type 2 diabetes is not known. ?What increases the risk? ?The following factors may make you more likely to develop this condition: ?Having a family member with type 2 diabetes. ?Being overweight or obese. ?Being inactive (sedentary). ?Having been diagnosed with insulin resistance. ?Having a history of prediabetes, diabetes when you were pregnant (gestational diabetes), or polycystic ovary syndrome (PCOS). ?What are the signs or symptoms? ?In the early stage of this condition, you may not have symptoms. Symptoms develop slowly and may include: ?Increased thirst or hunger. ?Increased urination. ?Unexplained weight loss. ?Tiredness (fatigue) or weakness. ?Vision changes, such as blurry vision. ?Dark patches on the skin. ?How is this diagnosed? ?This condition is diagnosed based on your symptoms, your medical history, a physical exam, and your blood glucose level. Your blood glucose may be checked with one or more of the following blood tests: ?A fasting blood glucose (FBG) test. You will not be allowed to eat (you will fast) for 8 hours or longer before a blood sample is taken. ?A random blood glucose test. This test checks blood glucose at any time of day regardless of when you ate. ?An A1C (hemoglobin A1C) blood test. This test provides information about blood  glucose levels over the previous 2-3 months. ?An oral glucose tolerance test (OGTT). This test measures your blood glucose at two times: ?After fasting. This is your baseline blood glucose level. ?Two hours after drinking a beverage that contains glucose. ?You may be diagnosed with type 2 diabetes if: ?Your fasting blood glucose level is 126 mg/dL (7.0 mmol/L) or higher. ?Your random blood glucose level is 200 mg/dL (11.1 mmol/L) or higher. ?Your A1C level is 6.5% or higher. ?Your oral glucose tolerance test result is higher than 200 mg/dL (11.1 mmol/L). ?These blood tests may be repeated to confirm your diagnosis. ?How is this treated? ?Your treatment may be managed by a specialist called an endocrinologist. Type 2 diabetes may be treated by following instructions from your health care provider about: ?Making dietary and lifestyle changes. These may include: ?Following a personalized nutrition plan that is developed by a registered dietitian. ?Exercising regularly. ?Finding ways to manage stress. ?Checking your blood glucose level as often as told. ?Taking diabetes medicines or insulin daily. This helps to keep your blood glucose levels in the healthy range. ?Taking medicines to help prevent complications from diabetes. Medicines may include: ?Aspirin. ?Medicine to lower cholesterol. ?Medicine to control blood pressure. ?Your health care provider will set treatment goals for you. Your goals will be based on your age, other medical conditions you have, and how you respond to diabetes treatment. Generally, the goal of treatment is to maintain the following blood glucose levels: ?Before meals: 80-130 mg/dL (4.4-7.2 mmol/L). ?After meals: below 180 mg/dL (10 mmol/L). ?A1C level: less than 7%. ?Follow these instructions at home: ?Questions to ask your health care provider ?  Consider asking the following questions: ?Should I meet with a certified diabetes care and education specialist? ?What diabetes medicines do I need,  and when should I take them? ?What equipment will I need to manage my diabetes at home? ?How often do I need to check my blood glucose? ?Where can I find a support group for people with diabetes? ?What number can I call if I have questions? ?When is my next appointment? ?General instructions ?Take over-the-counter and prescription medicines only as told by your health care provider. ?Keep all follow-up visits. This is important. ?Where to find more information ?For help and guidance and for more information about diabetes, please visit: ?American Diabetes Association (ADA): www.diabetes.org ?American Association of Diabetes Care and Education Specialists (ADCES): www.diabeteseducator.org ?International Diabetes Federation (IDF): www.idf.org ?Contact a health care provider if: ?Your blood glucose is at or above 240 mg/dL (13.3 mmol/L) for 2 days in a row. ?You have been sick or have had a fever for 2 days or longer, and you are not getting better. ?You have any of the following problems for more than 6 hours: ?You cannot eat or drink. ?You have nausea and vomiting. ?You have diarrhea. ?Get help right away if: ?You have severe hypoglycemia. This means your blood glucose is lower than 54 mg/dL (3.0 mmol/L). ?You become confused or you have trouble thinking clearly. ?You have difficulty breathing. ?You have moderate or large ketone levels in your urine. ?These symptoms may represent a serious problem that is an emergency. Do not wait to see if the symptoms will go away. Get medical help right away. Call your local emergency services (911 in the [place] Do not drive yourself to the hospital. ?Summary ?Type 2 diabetes mellitus is a long-term, or chronic, disease. In type 2 diabetes, the pancreas does not make enough of a hormone called insulin, or cells in the body do not respond properly to insulin that the body makes. ?This condition is treated by making dietary and lifestyle changes and taking diabetes medicines or  insulin. ?Your health care provider will set treatment goals for you. Your goals will be based on your age, other medical conditions you have, and how you respond to diabetes treatment. ?Keep all follow-up visits. This is important. ?This information is not intended to replace advice given to you by your health care provider. Make sure you discuss any questions you have with your health care provider. ?Document Revised: 06/04/2020 Document Reviewed: 06/04/2020 ?Elsevier Patient Education ? 2022 [employer] ? ?

## 2021-06-22 ENCOUNTER — Encounter: Payer: Self-pay | Admitting: Internal Medicine

## 2021-07-30 ENCOUNTER — Ambulatory Visit: Payer: Medicare Other | Admitting: Internal Medicine

## 2021-08-01 ENCOUNTER — Ambulatory Visit (INDEPENDENT_AMBULATORY_CARE_PROVIDER_SITE_OTHER): Payer: Medicare Other | Admitting: Family Medicine

## 2021-08-01 ENCOUNTER — Encounter: Payer: Self-pay | Admitting: Family Medicine

## 2021-08-01 VITALS — BP 136/82 | HR 64 | Temp 97.6°F | Ht 68.75 in | Wt 173.0 lb

## 2021-08-01 DIAGNOSIS — R739 Hyperglycemia, unspecified: Secondary | ICD-10-CM | POA: Diagnosis not present

## 2021-08-01 DIAGNOSIS — E139 Other specified diabetes mellitus without complications: Secondary | ICD-10-CM

## 2021-08-01 MED ORDER — INSULIN ASPART 100 UNIT/ML IJ SOLN: 5.0000 [IU] | Freq: Two times a day (BID) | INTRAMUSCULAR | 99 refills | Status: DC

## 2021-08-01 NOTE — Progress Notes (Signed)
? ?Subjective:  ? ? ? Patient ID: Joseph Walker, male    DOB: 11-25-1950, 71 y.o.   MRN: [id number] ? ?Chief Complaint  ?Patient presents with  ? Blood Sugar Problem  ?  After he ate around 9 am BS was 60/58 and since then BS have been "everywhere." Pt states at most they have been 242 but they are now ranging in the 300s but continue to go up and down. Denies any change in diet.  ? ? ?HPI ?Patient is in today for episode of hypoglycemia, blood sugar 60 so he drank Pepsi and his BS went to 125. This was his only episode of low blood sugar.  ? ?States his blood sugars are very high after eating, in the 240-300 range. Reports taking 12 units of insulin Toujeo  and oral Xigduo XR.  ?Recently saw dietician and now has a Dexcom.  ? ?Reports eating 3 meals per day.  ? ?Denies fever, chills, dizziness, chest pain, palpitations, shortness of breath, abdominal pain, N/V/D, urinary symptoms, LE edema.  ? ? ? ? ?Health Maintenance Due  ?Topic Date Due  ? Pneumonia Vaccine 87+ Years old (1 - PCV) Never done  ? OPHTHALMOLOGY EXAM  Never done  ? TETANUS/TDAP  Never done  ? COLONOSCOPY (Pts 45-57yrs Insurance coverage will need to be confirmed)  Never done  ? Zoster Vaccines- Shingrix (1 of 2) Never done  ? COVID-19 Vaccine (3 - Booster for Pfizer series) 08/30/2019  ? ? ?Past Medical History:  ?Diagnosis Date  ? Diabetes mellitus without complication (HCC)   ? Glaucoma   ? Hyperlipemia   ? Hypertension   ? Tachycardia   ? ? ?Past Surgical History:  ?Procedure Laterality Date  ? TUMOR REMOVAL Left   ? Left Breast  ? ? ?Family History  ?Problem Relation Age of Onset  ? Glaucoma Mother   ? Cataracts Mother   ? Alzheimer's disease Mother   ? Lung disease Father   ? Alzheimer's disease Maternal Grandmother   ? Alzheimer's disease Maternal Grandfather   ? ? ?Social History  ? ?Socioeconomic History  ? Marital status: Single  ?  Spouse name: Not on file  ? Number of children: Not on file  ? Years of education: Not on file  ? Highest  education level: Not on file  ?Occupational History  ? Not on file  ?Tobacco Use  ? Smoking status: Never  ?  Passive exposure: Never  ? Smokeless tobacco: Never  ?Vaping Use  ? Vaping Use: Never used  ?Substance and Sexual Activity  ? Alcohol use: Yes  ?  Alcohol/week: 2.0 standard drinks  ?  Types: 2 Cans of beer per week  ? Drug use: Never  ? Sexual activity: Yes  ?  Partners: Female  ?Other Topics Concern  ? Not on file  ?Social History Narrative  ? Not on file  ? ?Social Determinants of Health  ? ?Financial Resource Strain: Not on file  ?Food Insecurity: Not on file  ?Transportation Needs: Not on file  ?Physical Activity: Not on file  ?Stress: Not on file  ?Social Connections: Not on file  ?Intimate Partner Violence: Not on file  ? ? ?Outpatient Medications Prior to Visit  ?Medication Sig Dispense Refill  ? aspirin EC 81 MG tablet Take 1 tablet (81 mg total) by mouth daily. 90 tablet 1  ? Continuous Blood Gluc Receiver (FREESTYLE LIBRE 2 READER) DEVI 1 Act by Does not apply route daily. 2 each 5  ?  Continuous Blood Gluc Sensor (FREESTYLE LIBRE 2 SENSOR) MISC USE DAILY 2 each 5  ? Dapagliflozin-metFORMIN HCl ER (XIGDUO XR) 12-998 MG TB24 Take 1 tablet by mouth daily. 90 tablet 1  ? dexlansoprazole (DEXILANT) 60 MG capsule Take 1 capsule (60 mg total) by mouth daily. 90 capsule 1  ? diltiazem (CARDIZEM CD) 240 MG 24 hr capsule Take 240 mg by mouth daily.    ? insulin glargine, 2 Unit Dial, (TOUJEO MAX SOLOSTAR) 300 UNIT/ML Solostar Pen Inject 50 Units into the skin daily. (Patient taking differently: Inject 12 Units into the skin daily. Take 12 units daily) 15 mL 1  ? Insulin Pen Needle 32G X 6 MM MISC 1 Act by Does not apply route daily. 100 each 1  ? losartan (COZAAR) 100 MG tablet Take 100 mg by mouth daily.    ? Metoprolol Succinate 25 MG CS24 Take by mouth.    ? rosuvastatin (CRESTOR) 20 MG tablet Take 1 tablet (20 mg total) by mouth daily. 90 tablet 1  ? ?No facility-administered medications prior to  visit.  ? ? ?No Known Allergies ? ?ROS ?Pertinent positives and negatives in the history of present illness. ? ?   ?Objective:  ?  ?Physical Exam ?Constitutional:   ?   Appearance: Normal appearance.  ?Eyes:  ?   Conjunctiva/sclera: Conjunctivae normal.  ?   Pupils: Pupils are equal, round, and reactive to light.  ?Cardiovascular:  ?   Rate and Rhythm: Normal rate.  ?Pulmonary:  ?   Effort: Pulmonary effort is normal.  ?   Breath sounds: Normal breath sounds.  ?Musculoskeletal:     ?   General: Normal range of motion.  ?   Cervical back: Normal range of motion and neck supple.  ?Skin: ?   General: Skin is warm and dry.  ?Neurological:  ?   General: No focal deficit present.  ?   Mental Status: He is alert and oriented to person, place, and time.  ? ? ?BP 136/82 (BP Location: Left Arm, Patient Position: Sitting, Cuff Size: Large)   Pulse 64   Temp 97.6 ?F (36.4 ?C) (Temporal)   Ht 5' 8.75" (1.746 m)   Wt 173 lb (78.5 kg)   SpO2 98%   BMI 25.73 kg/m?  ?Wt Readings from Last 3 Encounters:  ?08/01/21 173 lb (78.5 kg)  ?06/20/21 173 lb (78.5 kg)  ?09/11/20 166 lb 6 oz (75.5 kg)  ? ? ?   ?Assessment & Plan:  ? ?Problem List Items Addressed This Visit   ? ?  ? Endocrine  ? Diabetes 1.5, managed as type 1 (HCC) - Primary  ? Relevant Medications  ? insulin aspart (NOVOLOG) 100 UNIT/ML injection  ? ?Other Visit Diagnoses   ? ? Hyperglycemia      ? Relevant Medications  ? insulin aspart (NOVOLOG) 100 UNIT/ML injection  ? ?  ? ?Discussed patient with his PCP, Dr. Yetta BarreJones. Counseling on preventing low blood sugars and keeping an eye on his BS.  ?Continue Toujeo 12 units daily. Continue Xigduo XR.  ?We will add meal time insulin, Novolog 5 units twice daily with large meals. In depth counseling done on blood sugar goal ranges and how to take meal time insulin 5 units only. He will not take Novolog if he does not eat a large meal or if his blood sugar is <120.  ?Advised him to check BS prior to exercise and eat a snack prior  to exercise if his BS is <100.  ?  Follow up with questions or when due for DM visit with his PCP.  ? ?Visit time 20 minutes in face to face communication with patient and coordination of care, additional 12 minutes spent in record review, coordination or care, ordering tests, communicating/referring to other healthcare professionals, documenting in medical records all on the same day of the visit for total time 32 minutes spent on the visit.  ? ? ?I am having Iyad B. Mccalister start on insulin aspart. I am also having him maintain his diltiazem, losartan, Metoprolol Succinate, Insulin Pen Needle, dexlansoprazole, FreeStyle Libre 2 Reader, FreeStyle Libre 2 Sensor, aspirin EC, rosuvastatin, Xigduo XR, and [employer] ? ?Meds ordered this encounter  ?Medications  ? insulin aspart (NOVOLOG) 100 UNIT/ML injection  ?  Sig: Inject 5 Units into the skin 2 (two) times daily with a meal.  ?  Dispense:  10 mL  ?  Refill:  PRN  ?  Order Specific Question:   Supervising Provider  ?  Answer:   Hillard Danker A [[id number]]  ? ? ?

## 2021-08-01 NOTE — Patient Instructions (Signed)
Take the meal time insulin (Novolog) only if you eat a decent size meal with lunch and dinner.  ? ?Continue Toujeo 12 units once daily.  ? ?Continue other medications.  ? ?Keep a close eye on your blood sugars . ? ?Follow up with Dr. Yetta Barre.  ?

## 2021-08-02 ENCOUNTER — Encounter: Payer: Self-pay | Admitting: Internal Medicine

## 2021-08-06 ENCOUNTER — Other Ambulatory Visit: Payer: Self-pay | Admitting: Internal Medicine

## 2021-08-06 DIAGNOSIS — E785 Hyperlipidemia, unspecified: Secondary | ICD-10-CM

## 2021-08-06 MED ORDER — ATORVASTATIN CALCIUM 40 MG PO TABS: 40.0000 mg | ORAL_TABLET | Freq: Every day | ORAL | 1 refills | Status: DC

## 2021-08-13 DIAGNOSIS — I10 Essential (primary) hypertension: Secondary | ICD-10-CM | POA: Diagnosis not present

## 2021-08-13 DIAGNOSIS — E119 Type 2 diabetes mellitus without complications: Secondary | ICD-10-CM | POA: Diagnosis not present

## 2021-08-13 DIAGNOSIS — I471 Supraventricular tachycardia: Secondary | ICD-10-CM | POA: Diagnosis not present

## 2021-08-13 DIAGNOSIS — E785 Hyperlipidemia, unspecified: Secondary | ICD-10-CM | POA: Diagnosis not present

## 2021-08-27 ENCOUNTER — Telehealth: Payer: Self-pay | Admitting: Internal Medicine

## 2021-08-27 ENCOUNTER — Other Ambulatory Visit: Payer: Self-pay | Admitting: Internal Medicine

## 2021-08-27 DIAGNOSIS — E139 Other specified diabetes mellitus without complications: Secondary | ICD-10-CM

## 2021-08-27 MED ORDER — TOUJEO MAX SOLOSTAR 300 UNIT/ML ~~LOC~~ SOPN: 40.0000 [IU] | PEN_INJECTOR | Freq: Every day | SUBCUTANEOUS | 1 refills | Status: DC

## 2021-08-27 NOTE — Telephone Encounter (Signed)
Pt has been informed to decrease Toujeo to 40units. However, pt stated that he has only been taking 12units daily and started "that is what the pen allows because it doesn't go to 40" However eloborated and said that the issue started when Zigduo was added on to his regimen. He would like to keep OV scheduled for tomorrow to discuss with PCP.

## 2021-08-27 NOTE — Telephone Encounter (Signed)
Patient is concerned that his blood sugar is dropping - and that his medication may be too strong.  Please call patient

## 2021-08-27 NOTE — Telephone Encounter (Signed)
Pt is concerned that medications prescribed is  causing his blood sugar to drop too low. Stating that his blood sugars has been dropping overnight into the 60s while he is sleeping. He stated the low blood sugars causes him to be weak.   Pt scheduled an OV for 6/7 @ 10.20am to discuss.

## 2021-08-28 ENCOUNTER — Ambulatory Visit (INDEPENDENT_AMBULATORY_CARE_PROVIDER_SITE_OTHER): Payer: Medicare Other | Admitting: Internal Medicine

## 2021-08-28 ENCOUNTER — Encounter: Payer: Self-pay | Admitting: Internal Medicine

## 2021-08-28 VITALS — BP 134/80 | HR 83 | Temp 98.0°F | Resp 16 | Ht 68.75 in | Wt 174.0 lb

## 2021-08-28 DIAGNOSIS — R778 Other specified abnormalities of plasma proteins: Secondary | ICD-10-CM

## 2021-08-28 DIAGNOSIS — F321 Major depressive disorder, single episode, moderate: Secondary | ICD-10-CM | POA: Diagnosis not present

## 2021-08-28 DIAGNOSIS — E139 Other specified diabetes mellitus without complications: Secondary | ICD-10-CM

## 2021-08-28 LAB — HEPATIC FUNCTION PANEL
ALT: 12 U/L (ref 0–53)
AST: 19 U/L (ref 0–37)
Albumin: 4.5 g/dL (ref 3.5–5.2)
Alkaline Phosphatase: 51 U/L (ref 39–117)
Bilirubin, Direct: 0.1 mg/dL (ref 0.0–0.3)
Total Bilirubin: 0.6 mg/dL (ref 0.2–1.2)
Total Protein: 7.7 g/dL (ref 6.0–8.3)

## 2021-08-28 LAB — BASIC METABOLIC PANEL
BUN: 21 mg/dL (ref 6–23)
CO2: 25 mEq/L (ref 19–32)
Calcium: 10 mg/dL (ref 8.4–10.5)
Chloride: 99 mEq/L (ref 96–112)
Creatinine, Ser: 1.2 mg/dL (ref 0.40–1.50)
GFR: 61.61 mL/min (ref >60.00)
Glucose, Bld: 170 mg/dL — ABNORMAL HIGH (ref 70–99)
Potassium: 3.9 mEq/L (ref 3.5–5.1)
Sodium: 134 mEq/L — ABNORMAL LOW (ref 135–145)

## 2021-08-28 LAB — HEMOGLOBIN A1C: Hgb A1c MFr Bld: 8.9 % — ABNORMAL HIGH (ref 4.6–6.5)

## 2021-08-28 MED ORDER — VORTIOXETINE HBR 5 MG PO TABS: 5.0000 mg | ORAL_TABLET | Freq: Every day | ORAL | 0 refills | Status: DC

## 2021-08-28 NOTE — Patient Instructions (Signed)
Major Depressive Disorder, Adult Major depressive disorder (MDD) is a mental health condition. It may also be called clinical depression or unipolar depression. MDD causes symptoms of sadness, hopelessness, and loss of interest in things. These symptoms last most of the day, almost every day, for 2 weeks. MDD can also cause physical symptoms. It can interfere with relationships and with everyday activities, such as work, school, and activities that are usually pleasant. MDD may be mild, moderate, or severe. It may be single-episode MDD, which happens once, or recurrent MDD, which may occur multiple times. What are the causes? The exact cause of this condition is not known. MDD is most likely caused by a combination of things, which may include: Your personality traits. Learned or conditioned behaviors or thoughts or feelings that reinforce negativity. Any alcohol or substance misuse. Long-term (chronic) physical or mental health illness. Going through a traumatic experience or major life changes. What increases the risk? The following factors may make someone more likely to develop MDD: A family history of depression. Being a woman. Troubled family relationships. Abnormally low levels of certain brain chemicals. Traumatic or painful events in childhood, especially abuse or loss of a parent. A lot of stress from life experiences, such as poor living conditions or discrimination. Chronic physical illness or other mental health disorders. What are the signs or symptoms? The main symptoms of MDD usually include: Constant depressed or irritable mood. A loss of interest in things and activities. Other symptoms include: Sleeping or eating too much or too little. Unexplained weight gain or weight loss. Tiredness or low energy. Being agitated, restless, or weak. Feeling hopeless, worthless, or guilty. Trouble thinking clearly or making decisions. Thoughts of suicide or thoughts of harming  others. Isolating oneself or avoiding other people or activities. Trouble completing tasks, work, or any normal obligations. Severe symptoms of this condition may include: Psychotic depression.This may include false beliefs, or delusions. It may also include seeing, hearing, tasting, smelling, or feeling things that are not real (hallucinations). Chronic depression or persistent depressive disorder. This is low-level depression that lasts for at least 2 years. Melancholic depression, or feeling extremely sad and hopeless. Catatonic depression, which includes trouble speaking and trouble moving. How is this diagnosed? This condition may be diagnosed based on: Your symptoms. Your medical and mental health history. You may be asked questions about your lifestyle, including any drug and alcohol use. A physical exam. Blood tests to rule out other conditions. MDD is confirmed if you have the following symptoms most of the day, nearly every day, in a 2-week period: Either a depressed mood or loss of interest. At least four other MDD symptoms. How is this treated? This condition is usually treated by mental health professionals, such as psychologists, psychiatrists, and clinical social workers. You may need more than one type of treatment. Treatment may include: Psychotherapy, also called talk therapy or counseling. Types of psychotherapy include: Cognitive behavioral therapy (CBT). This teaches you to recognize unhealthy feelings, thoughts, and behaviors, and replace them with positive thoughts and actions. Interpersonal therapy (IPT). This helps you to improve the way you communicate with others or relate to them. Family therapy. This treatment includes members of your family. Medicines to treat anxiety and depression. These medicines help to balance the brain chemicals that affect your emotions. Lifestyle changes. You may be asked to: Limit alcohol use and avoid drug use. Get regular  exercise. Get plenty of sleep. Make healthy eating choices. Spend more time outdoors. Brain stimulation. This may   be done if symptoms are very severe and other treatments have not worked. Examples of this treatment are electroconvulsive therapy and transcranial magnetic stimulation. Follow these instructions at home: Activity Exercise regularly and spend time outdoors. Find activities that you enjoy doing, and make time to do them. Find healthy ways to manage stress, such as: Meditation or deep breathing. Spending time in nature. Journaling. Return to your normal activities as told by your health care provider. Ask your health care provider what activities are safe for you. Alcohol and drug use If you drink alcohol: Limit how much you use to: 0-1 drink a day for women who are not pregnant. 0-2 drinks a day for men. Be aware of how much alcohol is in your drink. In the [place] one drink equals one 12 oz bottle of beer (355 mL), one 5 oz glass of wine (148 mL), or one 1 oz glass of hard liquor (44 mL). Discuss your alcohol use with your health care provider. Alcohol can affect any antidepressant medicines you are taking. Discuss any drug use with your health care provider. General instructions  Take over-the-counter and prescription medicines only as told by your health care provider. Eat a healthy diet and get plenty of sleep. Consider joining a support group. Your health care provider may be able to recommend one. Keep all follow-up visits as told by your health care provider. This is important. Where to find more information National Alliance on Mental Illness: www.nami.org [place] National Institute of Mental Health: www.nimh.nih.gov Contact a health care provider if: Your symptoms get worse. You develop new symptoms. Get help right away if: You self-harm. You have serious thoughts about hurting yourself or others. You hallucinate. If you ever feel like you may hurt yourself or  others, or have thoughts about taking your own life, get help right away. Go to your nearest emergency department or: Call your local emergency services (911 in the [place] Call a suicide crisis helpline, such as the National Suicide Prevention Lifeline at 1-800-273-8255 or 988 in the [place] This is open 24 hours a day in the [place] Text the Crisis Text Line at 741741 (in the [place] Summary Major depressive disorder (MDD) is a mental health condition. MDD causes symptoms of sadness, hopelessness, and loss of interest in things. These symptoms last most of the day, almost every day, for 2 weeks. The symptoms of MDD can interfere with relationships and with everyday activities. Treatments and support are available for people who develop MDD. You may need more than one type of treatment. Get help right away if you have serious thoughts about hurting yourself or others. This information is not intended to replace advice given to you by your health care provider. Make sure you discuss any questions you have with your health care provider. Document Revised: 10/03/2020 Document Reviewed: 02/19/2019 Elsevier Patient Education  2023 Elsevier Inc.  

## 2021-08-28 NOTE — Progress Notes (Signed)
Subjective:  Patient ID: Joseph Walker, male    DOB: 04/18/1950  Age: 71 y.o. MRN: [id number]  CC: Hypertension, Hyperlipidemia, and Diabetes   HPI KJUAN SEIPP presents for f/up -  His lowest recorded blood sugar has been 69 - he has blood sugar excursions up to >200.Marland Kitchen His is only using 14 u of the basal insulin. He is active and denies DOE, CP, SOB.  His mother died in the last year and he complains of depression with apathy, anhedonia, and feeling helpless/hopeless.  Outpatient Medications Prior to Visit  Medication Sig Dispense Refill   aspirin EC 81 MG tablet Take 1 tablet (81 mg total) by mouth daily. 90 tablet 1   atorvastatin (LIPITOR) 40 MG tablet Take 1 tablet (40 mg total) by mouth daily. 90 tablet 1   Continuous Blood Gluc Receiver (FREESTYLE LIBRE 2 READER) DEVI 1 Act by Does not apply route daily. 2 each 5   Continuous Blood Gluc Sensor (FREESTYLE LIBRE 2 SENSOR) MISC USE DAILY 2 each 5   Dapagliflozin-metFORMIN HCl ER (XIGDUO XR) 12-998 MG TB24 Take 1 tablet by mouth daily. 90 tablet 1   diltiazem (CARDIZEM CD) 240 MG 24 hr capsule Take 240 mg by mouth daily.     insulin aspart (NOVOLOG) 100 UNIT/ML injection Inject 5 Units into the skin 2 (two) times daily with a meal. 10 mL PRN   Insulin Pen Needle 32G X 6 MM MISC 1 Act by Does not apply route daily. 100 each 1   losartan (COZAAR) 100 MG tablet Take 100 mg by mouth daily.     Metoprolol Succinate 25 MG CS24 Take by mouth.     insulin glargine, 2 Unit Dial, (TOUJEO MAX SOLOSTAR) 300 UNIT/ML Solostar Pen Inject 40 Units into the skin daily. 15 mL 1   No facility-administered medications prior to visit.    ROS Review of Systems  Constitutional:  Negative for chills, diaphoresis, fatigue and fever.  HENT: Negative.    Eyes: Negative.   Respiratory:  Negative for cough, chest tightness, shortness of breath and wheezing.   Cardiovascular:  Negative for chest pain, palpitations and leg swelling.  Gastrointestinal:   Negative for abdominal pain, constipation and diarrhea.  Endocrine: Negative.   Genitourinary: Negative.   Musculoskeletal: Negative.   Skin: Negative.   Neurological:  Negative for dizziness, weakness and light-headedness.  Hematological:  Negative for adenopathy. Does not bruise/bleed easily.  Psychiatric/Behavioral:  Positive for dysphoric mood and sleep disturbance. Negative for confusion, decreased concentration and suicidal ideas. The patient is not nervous/anxious and is not hyperactive.     Objective:  BP 134/80 (BP Location: Right Arm, Patient Position: Sitting, Cuff Size: Large)   Pulse 83   Temp 98 F (36.7 C) (Oral)   Resp 16   Ht 5' 8.75" (1.746 m)   Wt 174 lb (78.9 kg)   SpO2 95%   BMI 25.88 kg/m   BP Readings from Last 3 Encounters:  08/28/21 134/80  08/01/21 136/82  06/20/21 130/82    Wt Readings from Last 3 Encounters:  08/28/21 174 lb (78.9 kg)  08/01/21 173 lb (78.5 kg)  06/20/21 173 lb (78.5 kg)    Physical Exam Vitals reviewed.  HENT:     Nose: Nose normal.     Mouth/Throat:     Mouth: Mucous membranes are moist.  Eyes:     General: No scleral icterus.    Conjunctiva/sclera: Conjunctivae normal.  Cardiovascular:     Rate and Rhythm: Normal  rate and regular rhythm.     Heart sounds: No murmur heard. Pulmonary:     Effort: Pulmonary effort is normal.     Breath sounds: No stridor. No wheezing, rhonchi or rales.  Abdominal:     General: Abdomen is flat.     Palpations: There is no mass.     Tenderness: There is no abdominal tenderness. There is no guarding.     Hernia: No hernia is present.  Musculoskeletal:        General: Normal range of motion.     Cervical back: Neck supple.     Right lower leg: No edema.     Left lower leg: No edema.  Lymphadenopathy:     Cervical: No cervical adenopathy.  Skin:    General: Skin is warm and dry.  Neurological:     General: No focal deficit present.     Mental Status: He is alert.  Psychiatric:         Attention and Perception: Attention and perception normal.        Mood and Affect: Mood is anxious and depressed. Affect is tearful. Affect is not flat or angry.        Speech: Speech normal.        Behavior: Behavior normal. Behavior is cooperative.        Thought Content: Thought content normal. Thought content is not paranoid or delusional. Thought content does not include homicidal or suicidal ideation.        Cognition and Memory: Cognition normal.     Lab Results  Component Value Date   WBC 5.1 06/20/2021   HGB 15.6 06/20/2021   HCT 46.7 06/20/2021   PLT 188.0 06/20/2021   GLUCOSE 170 (H) 08/28/2021   CHOL 144 06/20/2021   TRIG 278.0 (H) 06/20/2021   HDL 47.80 06/20/2021   LDLDIRECT 65.0 06/20/2021   ALT 12 08/28/2021   AST 19 08/28/2021   NA 134 (L) 08/28/2021   K 3.9 08/28/2021   CL 99 08/28/2021   CREATININE 1.20 08/28/2021   BUN 21 08/28/2021   CO2 25 08/28/2021   TSH 2.43 06/20/2021   PSA 2.25 07/04/2020   HGBA1C 8.9 (H) 08/28/2021   MICROALBUR <0.7 06/20/2021    No results found.  Assessment & Plan:   Dayton ScrapeJohnnie was seen today for hypertension, hyperlipidemia and diabetes.  Diagnoses and all orders for this visit:  Diabetes 1.5, managed as type 1 (HCC)- His A1c remains too high at 8.9%.  I recommended that he increase the basal insulin to 20 units a day. -     Basic metabolic panel; Future -     Hemoglobin A1c; Future -     Hemoglobin A1c -     Basic metabolic panel -     Discontinue: insulin glargine, 2 Unit Dial, (TOUJEO MAX SOLOSTAR) 300 UNIT/ML Solostar Pen; Inject 14 Units into the skin daily. -     insulin glargine, 2 Unit Dial, (TOUJEO MAX SOLOSTAR) 300 UNIT/ML Solostar Pen; Inject 20 Units into the skin daily.  Hypercalcemia- His calcium level is normal now. -     PTH, intact and calcium; Future -     Basic metabolic panel; Future -     Protein electrophoresis, serum; Future -     Protein electrophoresis, serum -     Basic metabolic  panel -     PTH, intact and calcium  Elevated total protein -     Protein electrophoresis, serum; Future -  Hepatic function panel; Future -     Hepatic function panel -     Protein electrophoresis, serum  Current moderate episode of major depressive disorder without prior episode (HCC) -     vortioxetine HBr (TRINTELLIX) 5 MG TABS tablet; Take 1 tablet (5 mg total) by mouth daily.   I have discontinued Kailand B. Violette's Toujeo Max SoloStar. I have also changed his [employer]. Additionally, I am having him start on vortioxetine HBr. Lastly, I am having him maintain his diltiazem, losartan, Metoprolol Succinate, Insulin Pen Needle, FreeStyle Libre 2 Reader, FreeStyle Libre 2 Sensor, aspirin EC, Xigduo XR, insulin aspart, and atorvastatin.  Meds ordered this encounter  Medications   vortioxetine HBr (TRINTELLIX) 5 MG TABS tablet    Sig: Take 1 tablet (5 mg total) by mouth daily.    Dispense:  30 tablet    Refill:  0   DISCONTD: insulin glargine, 2 Unit Dial, (TOUJEO MAX SOLOSTAR) 300 UNIT/ML Solostar Pen    Sig: Inject 14 Units into the skin daily.    Dispense:  6 mL    Refill:  1   insulin glargine, 2 Unit Dial, (TOUJEO MAX SOLOSTAR) 300 UNIT/ML Solostar Pen    Sig: Inject 20 Units into the skin daily.    Dispense:  6 mL    Refill:  1     Follow-up: Return in about 6 months (around 02/27/2022).  Sanda Linger, MD

## 2021-08-29 ENCOUNTER — Telehealth: Payer: Self-pay | Admitting: Internal Medicine

## 2021-08-29 MED ORDER — TOUJEO MAX SOLOSTAR 300 UNIT/ML ~~LOC~~ SOPN
14.0000 [IU] | PEN_INJECTOR | Freq: Every day | SUBCUTANEOUS | 1 refills | Status: DC
Start: 2021-08-29 — End: 2021-08-29

## 2021-08-29 MED ORDER — TOUJEO MAX SOLOSTAR 300 UNIT/ML ~~LOC~~ SOPN: 20.0000 [IU] | PEN_INJECTOR | Freq: Every day | SUBCUTANEOUS | 1 refills | Status: DC

## 2021-08-29 NOTE — Telephone Encounter (Signed)
Patient does not use MYChart - he would like someone to call him with his lab results from yesterday - Please call patient today is at all possible.

## 2021-08-30 LAB — PROTEIN ELECTROPHORESIS, SERUM
Albumin ELP: 4.5 g/dL (ref 3.8–4.8)
Alpha 1: 0.3 g/dL (ref 0.2–0.3)
Alpha 2: 0.7 g/dL (ref 0.5–0.9)
Beta 2: 0.4 g/dL (ref 0.2–0.5)
Beta Globulin: 0.4 g/dL (ref 0.4–0.6)
Gamma Globulin: 1.3 g/dL (ref 0.8–1.7)
Total Protein: 7.5 g/dL (ref 6.1–8.1)

## 2021-08-30 LAB — PTH, INTACT AND CALCIUM
Calcium: 9.9 mg/dL (ref 8.6–10.3)
PTH: 36 pg/mL (ref 16–77)

## 2021-09-13 ENCOUNTER — Other Ambulatory Visit: Payer: Self-pay | Admitting: Internal Medicine

## 2021-09-13 ENCOUNTER — Telehealth: Payer: Self-pay

## 2021-09-13 DIAGNOSIS — E139 Other specified diabetes mellitus without complications: Secondary | ICD-10-CM

## 2021-09-13 NOTE — Telephone Encounter (Signed)
Pt is requesting a refill on: Insulin Pen Needle 32G X 6 MM MISC  Pharmacy: [facility] [phone] - [place], [place] - [address]  LOV 08/28/21 ROV 02/27/22

## 2021-09-16 MED ORDER — BD PEN NEEDLE MICRO U/F 32G X 6 MM MISC: 1 refills | Status: DC

## 2021-11-11 ENCOUNTER — Other Ambulatory Visit: Payer: Self-pay

## 2021-11-11 ENCOUNTER — Emergency Department (HOSPITAL_COMMUNITY)
Admission: EM | Admit: 2021-11-11 | Discharge: 2021-11-11 | Disposition: A | Discharge status: Home / Self Care | Payer: Medicare Other | Attending: Emergency Medicine | Admitting: Emergency Medicine

## 2021-11-11 ENCOUNTER — Emergency Department (HOSPITAL_COMMUNITY): Payer: Medicare Other

## 2021-11-11 ENCOUNTER — Encounter (HOSPITAL_COMMUNITY): Payer: Self-pay

## 2021-11-11 DIAGNOSIS — R42 Dizziness and giddiness: Secondary | ICD-10-CM | POA: Diagnosis not present

## 2021-11-11 DIAGNOSIS — Z79899 Other long term (current) drug therapy: Secondary | ICD-10-CM | POA: Insufficient documentation

## 2021-11-11 DIAGNOSIS — Z7982 Long term (current) use of aspirin: Secondary | ICD-10-CM | POA: Diagnosis not present

## 2021-11-11 DIAGNOSIS — I10 Essential (primary) hypertension: Secondary | ICD-10-CM | POA: Diagnosis not present

## 2021-11-11 DIAGNOSIS — Z794 Long term (current) use of insulin: Secondary | ICD-10-CM | POA: Diagnosis not present

## 2021-11-11 DIAGNOSIS — E119 Type 2 diabetes mellitus without complications: Secondary | ICD-10-CM | POA: Insufficient documentation

## 2021-11-11 LAB — BASIC METABOLIC PANEL
Anion gap: 10 (ref 5–15)
BUN: 17 mg/dL (ref 8–23)
CO2: 24 mmol/L (ref 22–32)
Calcium: 9.4 mg/dL (ref 8.9–10.3)
Chloride: 101 mmol/L (ref 98–111)
Creatinine, Ser: 1.19 mg/dL (ref 0.61–1.24)
GFR, Estimated: >60 mL/min (ref >60)
Glucose, Bld: 167 mg/dL — ABNORMAL HIGH (ref 70–99)
Potassium: 3.9 mmol/L (ref 3.5–5.1)
Sodium: 135 mmol/L (ref 135–145)

## 2021-11-11 LAB — CBC
HCT: 43.2 % (ref 39.0–52.0)
Hemoglobin: 14.1 g/dL (ref 13.0–17.0)
MCH: 26.8 pg (ref 26.0–34.0)
MCHC: 32.6 g/dL (ref 30.0–36.0)
MCV: 82 fL (ref 80.0–100.0)
Platelets: 187 10*3/uL (ref 150–400)
RBC: 5.27 MIL/uL (ref 4.22–5.81)
RDW: 13.7 % (ref 11.5–15.5)
WBC: 6.5 10*3/uL (ref 4.0–10.5)
nRBC: 0 % (ref 0.0–0.2)

## 2021-11-11 LAB — CBG MONITORING, ED: Glucose-Capillary: 161 mg/dL — ABNORMAL HIGH (ref 70–99)

## 2021-11-11 MED ORDER — MECLIZINE HCL 25 MG PO TABS: 25.0000 mg | ORAL_TABLET | Freq: Three times a day (TID) | ORAL | 0 refills | Status: DC | PRN

## 2021-11-11 NOTE — ED Provider Notes (Signed)
[facility] EMERGENCY DEPARTMENT Provider Note   CSN: [id number] Arrival date & time: 11/11/21  4098     History  Chief Complaint  Patient presents with   Dizziness    Joseph Walker is a 71 y.o. male with past medical history of atrial fibrillation on diltiazem, diabetes, glaucoma, hyperlipidemia, presents the emergency department for intermittent vertiginous symptoms since this morning.  The patient states that he had woken up in his normal state of health earlier this morning and had turned over and suddenly experienced a room spinning sensation he describes as dizziness.  He states that as soon as he had stood up approximately 60 seconds later his dizziness had resolved.  He had gone on to experience approximately 2-3 more episodes of positionally related transient 62nd episodes of vertiginous symptoms that had spontaneously resolved.  He notes that since presenting to the emergency department several hours prior he has not had any additional vertiginous episodes and is felt in his normal baseline health.  He has no associated chest pain shortness of breath, abdominal pain, nausea or vomiting.  He has ambulated on multiple occasions throughout triage and in the emergency department without any difficulty.   Dizziness Associated symptoms: no chest pain, no palpitations, no shortness of breath and no vomiting    Past Medical History:  Diagnosis Date   Diabetes mellitus without complication (HCC)    Glaucoma    Hyperlipemia    Hypertension    Tachycardia        Home Medications Prior to Admission medications   Medication Sig Start Date End Date Taking? Authorizing Provider  aspirin EC 81 MG tablet Take 1 tablet (81 mg total) by mouth daily. 06/20/21   Etta Grandchild, MD  atorvastatin (LIPITOR) 40 MG tablet Take 1 tablet (40 mg total) by mouth daily. 08/06/21   Etta Grandchild, MD  Continuous Blood Gluc Receiver (FREESTYLE LIBRE 2 READER) DEVI 1 Act by Does not  apply route daily. 06/20/21   Etta Grandchild, MD  Continuous Blood Gluc Sensor (FREESTYLE LIBRE 2 SENSOR) MISC USE DAILY 06/20/21   Etta Grandchild, MD  Dapagliflozin-metFORMIN HCl ER (XIGDUO XR) 12-998 MG TB24 Take 1 tablet by mouth daily. 06/20/21   Etta Grandchild, MD  diltiazem (CARDIZEM CD) 240 MG 24 hr capsule Take 240 mg by mouth daily.    [provider]  insulin aspart (NOVOLOG) 100 UNIT/ML injection Inject 5 Units into the skin 2 (two) times daily with a meal. 08/01/21   Henson, Vickie L, NP-C  insulin glargine, 2 Unit Dial, (TOUJEO MAX SOLOSTAR) 300 UNIT/ML Solostar Pen Inject 20 Units into the skin daily. 08/29/21   Etta Grandchild, MD  Insulin Pen Needle (BD PEN NEEDLE MICRO U/F) 32G X 6 MM MISC USE DAILY 09/16/21   Etta Grandchild, MD  losartan (COZAAR) 100 MG tablet Take 100 mg by mouth daily.    [provider]  Metoprolol Succinate 25 MG CS24 Take by mouth.    [provider]  vortioxetine HBr (TRINTELLIX) 5 MG TABS tablet Take 1 tablet (5 mg total) by mouth daily. 08/28/21   Etta Grandchild, MD      Allergies    Patient has no known allergies.    Review of Systems   Review of Systems  Constitutional:  Negative for chills and fever.  HENT:  Negative for ear pain and sore throat.   Eyes:  Negative for pain and visual disturbance.  Respiratory:  Negative for cough and shortness of breath.   Cardiovascular:  Negative for chest pain and palpitations.  Gastrointestinal:  Negative for abdominal pain and vomiting.  Genitourinary:  Negative for dysuria and hematuria.  Musculoskeletal:  Negative for arthralgias and back pain.  Skin:  Negative for color change and rash.  Neurological:  Positive for dizziness. Negative for seizures and syncope.  All other systems reviewed and are negative.   Physical Exam Updated Vital Signs BP (!) 156/94 (BP Location: Right Arm)   Pulse 77   Temp 97.8 F (36.6 C) (Oral)   Resp 16   Ht 5\' 8"  (1.727 m)   Wt 78.9 kg    SpO2 100%   BMI 26.46 kg/m  Physical Exam Vitals and nursing note reviewed.  Constitutional:      General: He is not in acute distress.    Appearance: He is well-developed.     Comments: Upon entering the exam room, the patient is sitting upright awake and alert no acute distress.  Very well-appearing  HENT:     Head: Normocephalic and atraumatic.  Eyes:     Conjunctiva/sclera: Conjunctivae normal.  Cardiovascular:     Rate and Rhythm: Normal rate and regular rhythm.     Heart sounds: No murmur heard.    Comments: Regular rate and rhythm no murmurs or gallops Pulmonary:     Effort: Pulmonary effort is normal. No respiratory distress.     Breath sounds: Normal breath sounds.  Abdominal:     Palpations: Abdomen is soft.     Tenderness: There is no abdominal tenderness.  Musculoskeletal:        General: No swelling.     Cervical back: Neck supple.  Skin:    General: Skin is warm and dry.     Capillary Refill: Capillary refill takes less than 2 seconds.  Neurological:     Mental Status: He is alert.     Comments: Fully alert and oriented moving all extremity spontaneously.  Cranial nerves II through XII intact.  No vertigo appreciated.  Grip strength 5 out of 5 and symmetric bilaterally.  Lower extremity strength 5 out of 5 and symmetric bilaterally.  Sensation intact and symmetric in all 4 extremities.  No dysdiadochokinesia on finger-nose.  Gait was witnessed personally by me and was normal.  Psychiatric:        Mood and Affect: Mood normal.     ED Results / Procedures / Treatments   Labs (all labs ordered are listed, but only abnormal results are displayed) Labs Reviewed  BASIC METABOLIC PANEL - Abnormal; Notable for the following components:      Result Value   Glucose, Bld 167 (*)    All other components within normal limits  CBG MONITORING, ED - Abnormal; Notable for the following components:   Glucose-Capillary 161 (*)    All other components within normal limits   CBC  URINALYSIS, ROUTINE W REFLEX MICROSCOPIC    EKG EKG Interpretation  Date/Time:  Monday November 11 2021 09:25:19 EDT Ventricular Rate:  78 PR Interval:  138 QRS Duration: 80 QT Interval:  360 QTC Calculation: 410 R Axis:   121 Text Interpretation: Normal sinus rhythm Right axis deviation new T wave abnormality, consider inferior ischemia from 2001 When compared with ECG of 19-Oct-1999 20:11, PREVIOUS ECG IS PRESENT Confirmed by 21-Oct-1999 (Gwyneth Sprout) on 11/11/2021 5:49:23 PM  Radiology CT Head Wo Contrast  Result Date: 11/11/2021 CLINICAL DATA:  Persistent dizziness. EXAM: CT HEAD WITHOUT CONTRAST TECHNIQUE: Contiguous  axial images were obtained from the base of the skull through the vertex without intravenous contrast. RADIATION DOSE REDUCTION: This exam was performed according to the departmental dose-optimization program which includes automated exposure control, adjustment of the mA and/or kV according to patient size and/or use of iterative reconstruction technique. COMPARISON:  None Available. FINDINGS: Brain: No evidence of acute infarction, hemorrhage, hydrocephalus, extra-axial collection or mass lesion/mass effect. Vascular: No hyperdense vessel or unexpected calcification. Skull: Normal. Negative for fracture or focal lesion. Sinuses/Orbits: No acute finding. Other: None. IMPRESSION: Negative head CT. Electronically Signed   By: Richarda Overlie M.D.   On: 11/11/2021 10:15    Procedures Procedures    Medications Ordered in ED Medications - No data to display  ED Course/ Medical Decision Making/ A&P                           Medical Decision Making Amount and/or Complexity of Data Reviewed Labs: ordered.   Patient presents the emergency department hemodynamically stable, afebrile and with intermittent positionally related vertiginous symptoms that have been absent for the past 6 to 8 hours.  Patient presentation is most consistent with benign positional vertigo and is  less likely central in etiology given lack of persistence of symptoms and clear positional nature.  Do not suspect orthostasis given patient's persistently normal to elevated blood pressures and lack of other physical exam findings to suggest hypovolemia.   I have personally reviewed and interpreted the patient's EKG which was markable for T wave inversions in lead III and aVF as well as right axis deviation.  No prior EKG for comparison.  Despite evidence of right heart strain on EKG patient has no associated cardiopulmonary symptoms thus do not suspect PE.   I personally reviewed and interpreted the patient's CT head which did not reveal any acute intracranial abnormality.  Patient was ultimately discharged home in hemodynamically stable condition with a short prescription for meclizine on as-needed basis.  Patient was instructed to follow-up with his primary care physician for an instructed to return for persistent vertiginous symptoms          Final Clinical Impression(s) / ED Diagnoses Final diagnoses:  Dizziness  Vertigo    Rx / DC Orders ED Discharge Orders     None         Duard Brady, MD 11/11/21 [id number]    Gwyneth Sprout, MD 11/12/21 [phone]

## 2021-11-11 NOTE — ED Triage Notes (Signed)
Woke up this am with dizziness when changing positions.  Denies room spinning. Hx afib but not in afib.

## 2021-11-11 NOTE — ED Provider Triage Note (Signed)
Emergency Medicine Provider Triage Evaluation Note  Joseph Walker , a 71 y.o. male  was evaluated in triage.  Pt complains of an episode of dizziness/presyncope this morning around 7:30 AM.  Lasted a few seconds, dissipated with rest.  Occurred when he went to stand up and lean over to put on his pants.  Denies shortness of breath, N/V, chest pain, or syncope.  No known Hx of BPPV or prior CVA.  No active dizziness.  Newly diagnosed with diabetes mellitus, POC blood glucose 160.  Hx of Afib.  Review of Systems  Positive:  Negative: See above  Physical Exam  BP (!) 147/86 (BP Location: Right Arm)   Pulse 74   Temp 98.8 F (37.1 C) (Oral)   Resp 16   Ht 5\' 8"  (1.727 m)   Wt 78.9 kg   SpO2 97%   BMI 26.46 kg/m  Gen:   Awake, no distress   Resp:  Normal effort  MSK:   Moves extremities without difficulty  Other:  Gait, coordination, motor function, and sensation appears grossly intact.  Negative pronator drift.  Normal EOMs.  CN V, VII, and IX through XII appear grossly intact.  Neuro exam grossly unremarkable.  Ambulates without issue.  HR is regular rate and rhythm.  Medical Decision Making  Medically screening exam initiated at 9:18 AM.  Appropriate orders placed.  BENIGNO CHECK was informed that the remainder of the evaluation will be completed by another provider, this initial triage assessment does not replace that evaluation, and the importance of remaining in the ED until their evaluation is complete.     Dulcy Fanny, Cecil Cobbs 11/11/21 [phone]

## 2021-11-11 NOTE — Discharge Instructions (Addendum)
You were seen in the emergency room today for dizziness.  We think that you have something called benign positional vertigo.  We are giving a prescription for; meclizine.  If you get dizzy again please take this medicine as needed.  Otherwise you may follow-up with your regular primary care doctor as needed moving forward.  If your dizziness persists for longer duration of time please come to the emergency department.

## 2021-11-21 DIAGNOSIS — I10 Essential (primary) hypertension: Secondary | ICD-10-CM | POA: Diagnosis not present

## 2021-11-21 DIAGNOSIS — E785 Hyperlipidemia, unspecified: Secondary | ICD-10-CM | POA: Diagnosis not present

## 2021-11-21 DIAGNOSIS — E119 Type 2 diabetes mellitus without complications: Secondary | ICD-10-CM | POA: Diagnosis not present

## 2021-11-26 ENCOUNTER — Ambulatory Visit (INDEPENDENT_AMBULATORY_CARE_PROVIDER_SITE_OTHER): Payer: Medicare Other | Admitting: Internal Medicine

## 2021-11-26 VITALS — BP 120/74 | HR 78 | Temp 97.8°F | Ht 68.0 in | Wt 175.0 lb

## 2021-11-26 DIAGNOSIS — J309 Allergic rhinitis, unspecified: Secondary | ICD-10-CM

## 2021-11-26 DIAGNOSIS — R42 Dizziness and giddiness: Secondary | ICD-10-CM | POA: Diagnosis not present

## 2021-11-26 DIAGNOSIS — H6982 Other specified disorders of Eustachian tube, left ear: Secondary | ICD-10-CM | POA: Diagnosis not present

## 2021-11-26 MED ORDER — CETIRIZINE HCL 10 MG PO TABS: 10.0000 mg | ORAL_TABLET | Freq: Every day | ORAL | 11 refills | Status: AC

## 2021-11-26 MED ORDER — GUAIFENESIN ER 600 MG PO TB12: 1200.0000 mg | ORAL_TABLET | Freq: Two times a day (BID) | ORAL | 1 refills | Status: AC | PRN

## 2021-11-26 MED ORDER — MECLIZINE HCL 25 MG PO TABS
12.5000 mg | ORAL_TABLET | Freq: Three times a day (TID) | ORAL | 1 refills | Status: DC | PRN
Start: 2021-11-26 — End: 2022-02-12

## 2021-11-26 MED ORDER — TRIAMCINOLONE ACETONIDE 55 MCG/ACT NA AERO: 2.0000 | INHALATION_SPRAY | Freq: Every day | NASAL | 12 refills | Status: DC

## 2021-11-26 NOTE — Patient Instructions (Signed)
Please take at least 2 wks straight of OTC zyrtec 10 mg per day, and OTC Nasacort twice per day  Please take all new medication as recommended - the mucinex twice per day as well  Please continue all other medications as before, and refills have been done if requested - meclizine for vertigo as needed  Please have the pharmacy call with any other refills you may need.  Please continue your efforts at being more active, low cholesterol diet, and weight control.  Please keep your appointments with your specialists as you may have planned

## 2021-11-26 NOTE — Telephone Encounter (Signed)
NOTE NOT NEEDED ?

## 2021-11-26 NOTE — Progress Notes (Unsigned)
Patient ID: Joseph Walker, male   DOB: 1950/06/08, 71 y.o.   MRN: [id number]        Chief Complaint: follow up allergies with left ear fullness and vertigo       HPI:  Joseph Walker is a 71 y.o. male here quite upset with recurring episodes vertigo in the past 2 wks, with left ear fullness and Does have several wks ongoing nasal allergy symptoms with clearish congestion, itch and sneezing, without fever, pain, ST, cough, swelling or wheezing.  Pt denies chest pain, increased sob or doe, wheezing, orthopnea, PND, increased LE swelling, palpitations, dizziness or syncope.   Pt denies polydipsia, polyuria, or new focal neuro s/s.   Had recent ED visit with neg Head CT and routine labs.         Wt Readings from Last 3 Encounters:  11/26/21 175 lb (79.4 kg)  11/11/21 174 lb (78.9 kg)  08/28/21 174 lb (78.9 kg)   BP Readings from Last 3 Encounters:  11/26/21 120/74  11/11/21 (!) 129/92  08/28/21 134/80         Past Medical History:  Diagnosis Date   Diabetes mellitus without complication (HCC)    Glaucoma    Hyperlipemia    Hypertension    Tachycardia    Past Surgical History:  Procedure Laterality Date   TUMOR REMOVAL Left    Left Breast    reports that he has never smoked. He has never been exposed to tobacco smoke. He has never used smokeless tobacco. He reports current alcohol use of about 2.0 standard drinks of alcohol per week. He reports that he does not use drugs. family history includes Alzheimer's disease in his maternal grandfather, maternal grandmother, and mother; Cataracts in his mother; Glaucoma in his mother; Lung disease in his father. No Known Allergies Current Outpatient Medications on File Prior to Visit  Medication Sig Dispense Refill   aspirin EC 81 MG tablet Take 1 tablet (81 mg total) by mouth daily. 90 tablet 1   atorvastatin (LIPITOR) 40 MG tablet Take 1 tablet (40 mg total) by mouth daily. 90 tablet 1   Continuous Blood Gluc Receiver (FREESTYLE LIBRE 2  READER) DEVI 1 Act by Does not apply route daily. 2 each 5   Continuous Blood Gluc Sensor (FREESTYLE LIBRE 2 SENSOR) MISC USE DAILY 2 each 5   Dapagliflozin-metFORMIN HCl ER (XIGDUO XR) 12-998 MG TB24 Take 1 tablet by mouth daily. 90 tablet 1   diltiazem (CARDIZEM CD) 240 MG 24 hr capsule Take 240 mg by mouth daily.     insulin aspart (NOVOLOG) 100 UNIT/ML injection Inject 5 Units into the skin 2 (two) times daily with a meal. 10 mL PRN   insulin glargine, 2 Unit Dial, (TOUJEO MAX SOLOSTAR) 300 UNIT/ML Solostar Pen Inject 20 Units into the skin daily. 6 mL 1   Insulin Pen Needle (BD PEN NEEDLE MICRO U/F) 32G X 6 MM MISC USE DAILY 100 each 1   losartan (COZAAR) 100 MG tablet Take 100 mg by mouth daily.     Metoprolol Succinate 25 MG CS24 Take by mouth.     vortioxetine HBr (TRINTELLIX) 5 MG TABS tablet Take 1 tablet (5 mg total) by mouth daily. 30 tablet 0   spironolactone (ALDACTONE) 25 MG tablet Take 25 mg by mouth daily.     No current facility-administered medications on file prior to visit.        ROS:  All others reviewed and negative.  Objective  PE:  BP 120/74 (BP Location: Right Arm, Patient Position: Sitting, Cuff Size: Large)   Pulse 78   Temp 97.8 F (36.6 C) (Oral)   Ht 5\' 8"  (1.727 m)   Wt 175 lb (79.4 kg)   SpO2 97%   BMI 26.61 kg/m                 Constitutional: Pt appears in NAD               HENT: Head: NCAT.                Right Ear: External ear normal.                 Left Ear: External ear normal. Bilat tm's with mild erythema.  Max sinus areas none tender.  Pharynx with mild erythema, no exudate                Eyes: . Pupils are equal, round, and reactive to light. Conjunctivae and EOM are normal               Nose: without d/c or deformity               Neck: Neck supple. Gross normal ROM               Cardiovascular: Normal rate and regular rhythm.                 Pulmonary/Chest: Effort normal and breath sounds without rales or wheezing.                 Abd:  Soft, NT, ND, + BS, no organomegaly               Neurological: Pt is alert. At baseline orientation, motor grossly intact               Skin: Skin is warm. No rashes, no other new lesions, LE edema - none               Psychiatric: Pt behavior is normal without agitation   Micro: none  Cardiac tracings I have personally interpreted today:  none  Pertinent Radiological findings (summarize): none   Lab Results  Component Value Date   WBC 6.5 11/11/2021   HGB 14.1 11/11/2021   HCT 43.2 11/11/2021   PLT 187 11/11/2021   GLUCOSE 167 (H) 11/11/2021   CHOL 144 06/20/2021   TRIG 278.0 (H) 06/20/2021   HDL 47.80 06/20/2021   LDLDIRECT 65.0 06/20/2021   ALT 12 08/28/2021   AST 19 08/28/2021   NA 135 11/11/2021   K 3.9 11/11/2021   CL 101 11/11/2021   CREATININE 1.19 11/11/2021   BUN 17 11/11/2021   CO2 24 11/11/2021   TSH 2.43 06/20/2021   PSA 2.25 07/04/2020   HGBA1C 8.9 (H) 08/28/2021   MICROALBUR <0.7 06/20/2021   Assessment/Plan:  Joseph Walker is a 71 y.o. Black or African American [2] male with  has a past medical history of Diabetes mellitus without complication (HCC), Glaucoma, Hyperlipemia, Hypertension, and Tachycardia.  Acute dysfunction of left eustachian tube For mucinex 1200 bid prn,  to f/u any worsening symptoms or concerns  Allergic rhinitis Mild to mod, for zyrtec 10 qd prn, and nasacot asd, to f/u any worsening symptoms or concerns  Vertigo Mild but recurrent positional and persistent x 2 wks, CT head neg which is reassuring, better with meclizine prn - for refill prn  Followup: Return if  symptoms worsen or fail to improve.  Joseph Barre, MD 11/27/2021 8:40 PM [facility]

## 2021-11-27 ENCOUNTER — Encounter: Payer: Self-pay | Admitting: Internal Medicine

## 2021-11-27 DIAGNOSIS — R42 Dizziness and giddiness: Secondary | ICD-10-CM | POA: Insufficient documentation

## 2021-11-27 DIAGNOSIS — H6992 Unspecified Eustachian tube disorder, left ear: Secondary | ICD-10-CM | POA: Insufficient documentation

## 2021-11-27 DIAGNOSIS — H6982 Other specified disorders of Eustachian tube, left ear: Secondary | ICD-10-CM | POA: Insufficient documentation

## 2021-11-27 DIAGNOSIS — J309 Allergic rhinitis, unspecified: Secondary | ICD-10-CM | POA: Insufficient documentation

## 2021-11-27 NOTE — Assessment & Plan Note (Signed)
For mucinex 1200 bid prn,  to f/u any worsening symptoms or concerns

## 2021-11-27 NOTE — Assessment & Plan Note (Signed)
Mild to mod, for zyrtec 10 qd prn, and nasacot asd, to f/u any worsening symptoms or concerns

## 2021-11-27 NOTE — Assessment & Plan Note (Signed)
Mild but recurrent positional and persistent x 2 wks, CT head neg which is reassuring, better with meclizine prn - for refill prn

## 2021-12-07 IMAGING — DX DG CHEST 2V
2 series · 2 of 2 positions shown · non-contrast
Comparison: None.

CLINICAL DATA: Left anterior chest wall pain for approximately 4
months, worse lying down. History of a left anterior chest tumor
removal. History of hypertension.

EXAM:
CHEST - 2 VIEW

[chest pa]
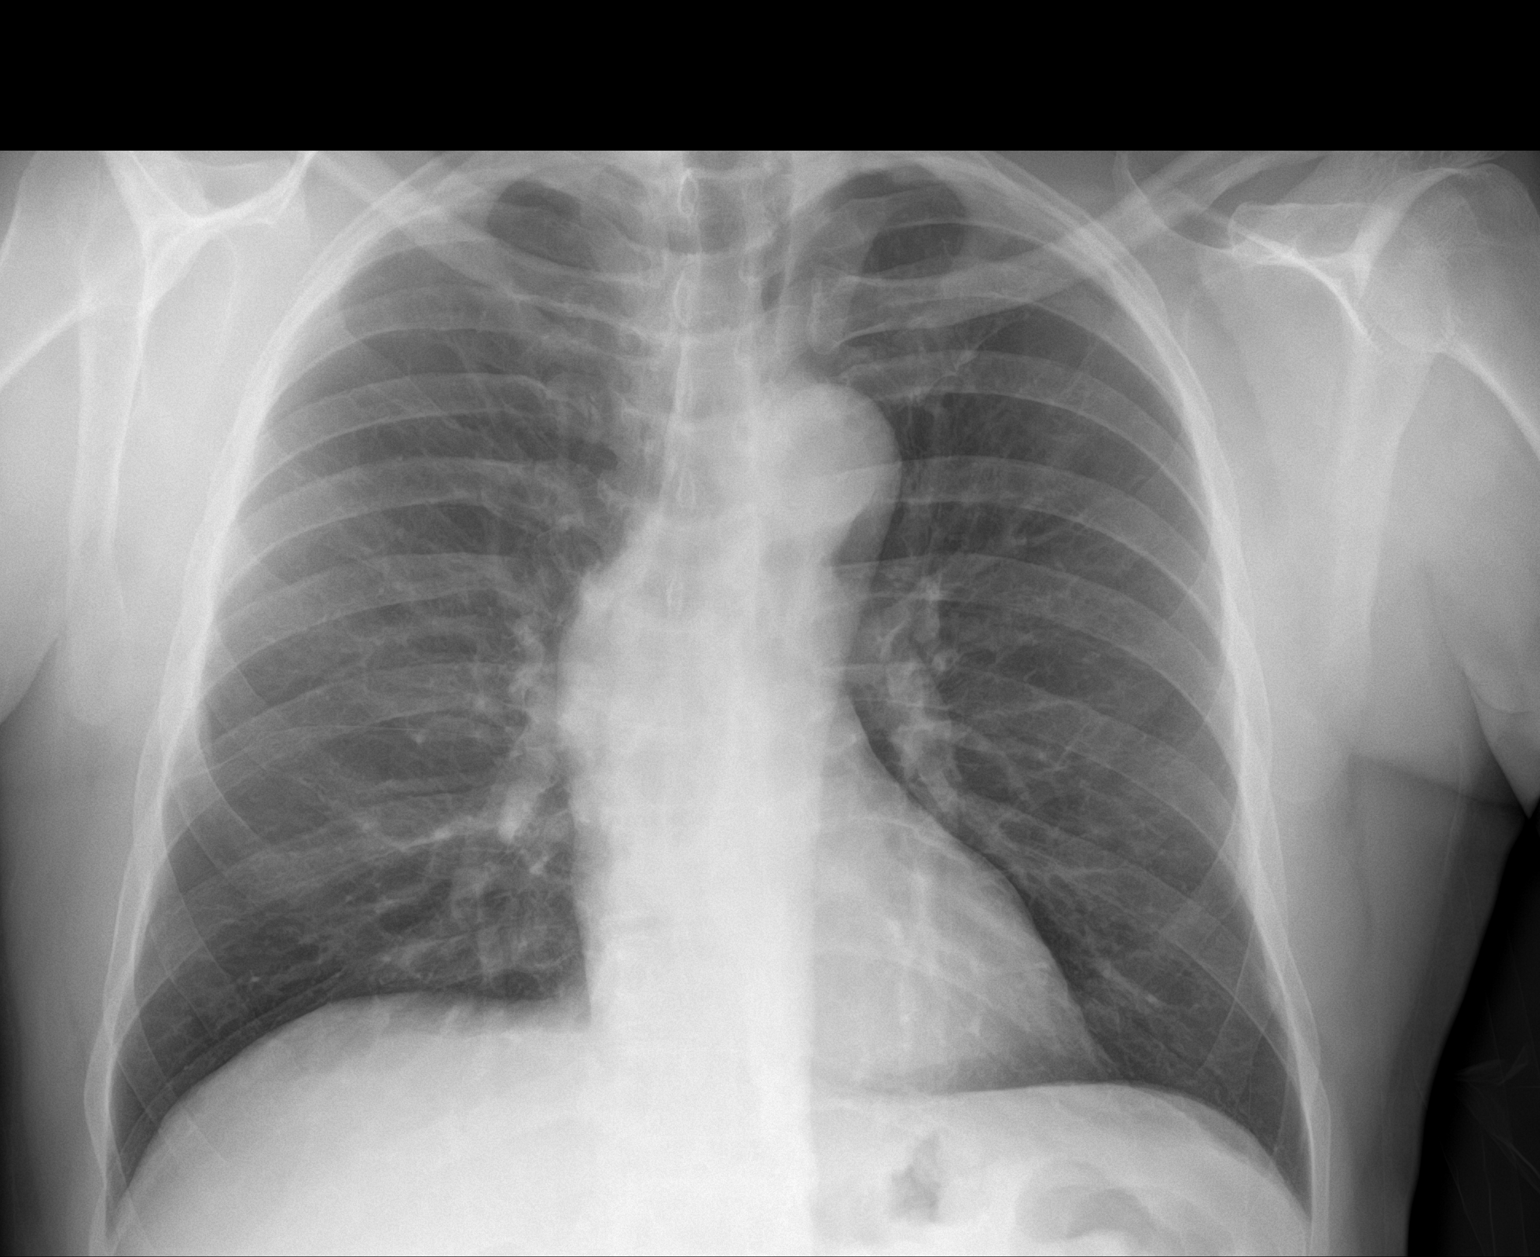

[chest lat]
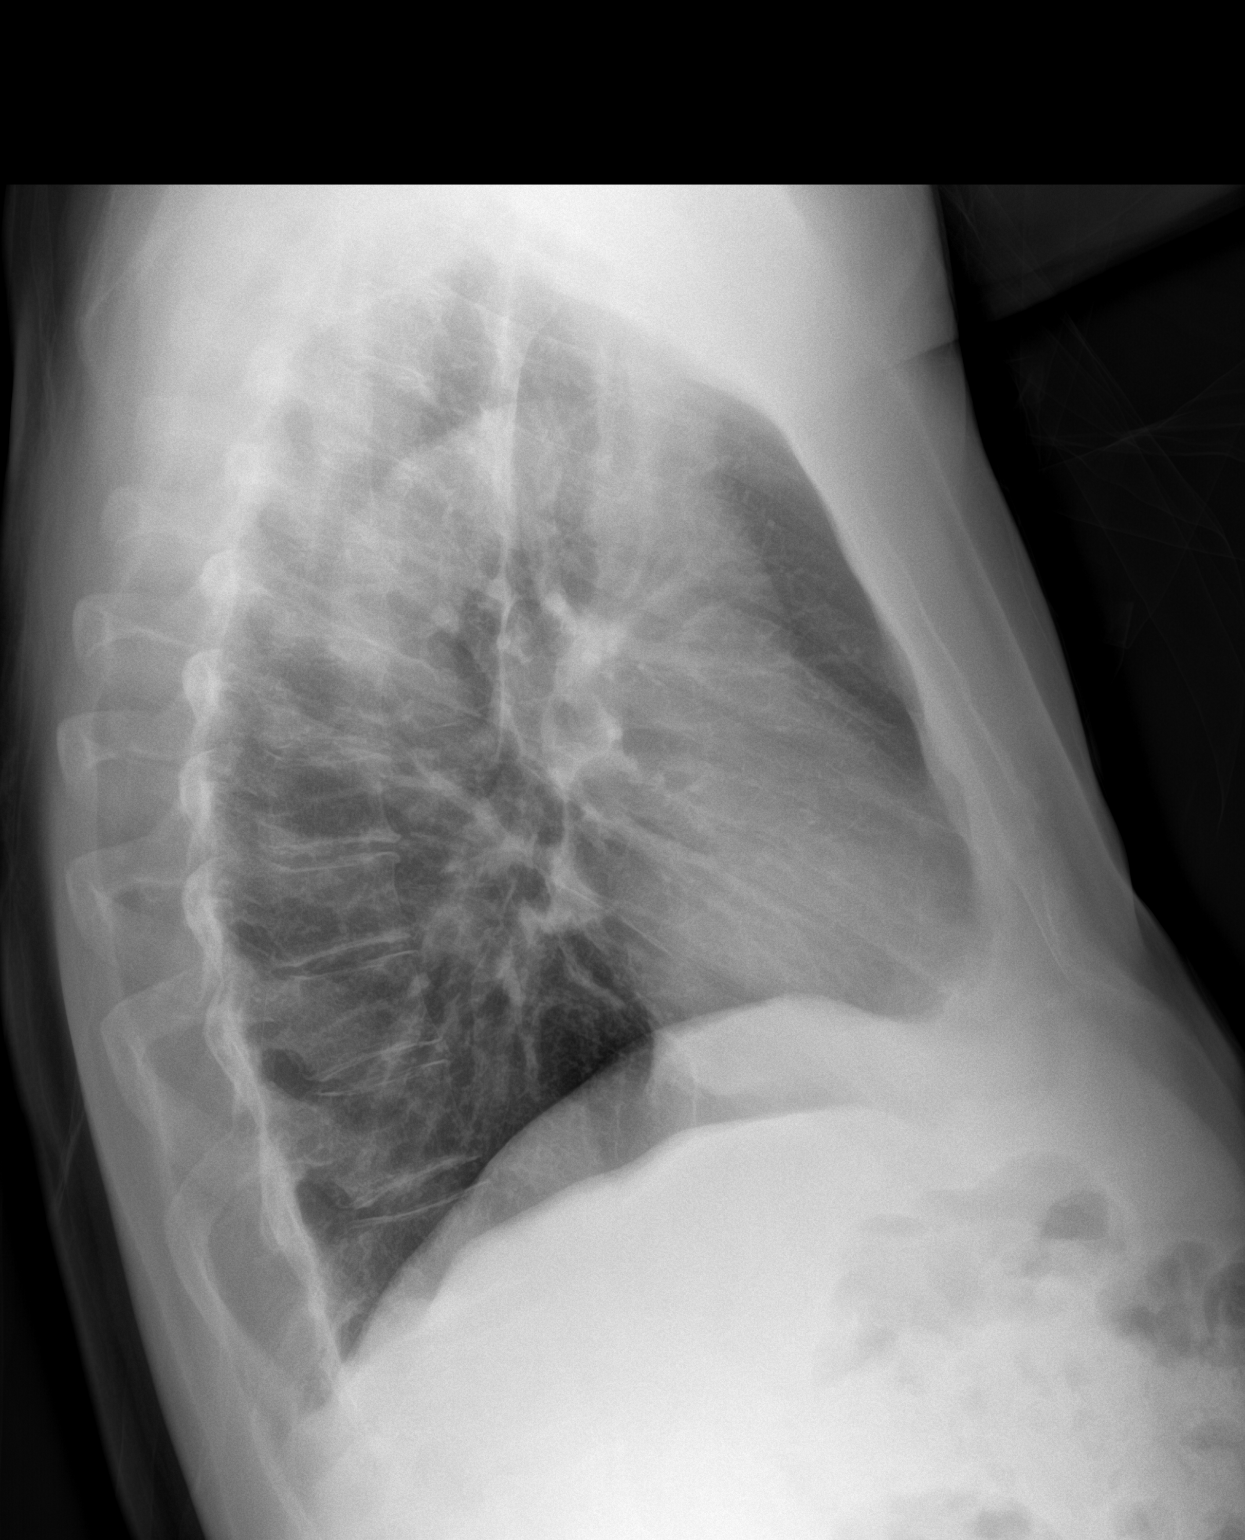

[2 of 2 positions shown; findings below may reference images not displayed]

FINDINGS: Cardiac silhouette is normal in size and configuration. No
mediastinal or hilar masses or evidence of adenopathy.

Clear lungs.

No pleural effusion or pneumothorax.

Skeletal structures are intact. No bone lesion. No radiographic
evidence of a soft tissue mass.
IMPRESSION: No active cardiopulmonary disease.

## 2022-02-04 ENCOUNTER — Telehealth: Payer: Self-pay | Admitting: Internal Medicine

## 2022-02-04 NOTE — Telephone Encounter (Signed)
LVM for pt to rtn my call to schedule AWV with [facility] call back # [phone] 

## 2022-02-11 ENCOUNTER — Other Ambulatory Visit: Payer: Self-pay | Admitting: Internal Medicine

## 2022-02-11 ENCOUNTER — Telehealth: Payer: Self-pay | Admitting: Internal Medicine

## 2022-02-11 DIAGNOSIS — N182 Chronic kidney disease, stage 2 (mild): Secondary | ICD-10-CM

## 2022-02-11 DIAGNOSIS — E139 Other specified diabetes mellitus without complications: Secondary | ICD-10-CM

## 2022-02-11 NOTE — Telephone Encounter (Signed)
Pt called to ask why does [facility] need to send refill request every 2 weeks for continuous glucose sensor. Pt says they told him to call and he does not understand why they don't just give it to him when his PCP has already told them to. Please advise [phone]

## 2022-02-11 NOTE — Telephone Encounter (Signed)
Pt has been informed that Rx refill was sent earlier today by PCP.

## 2022-02-12 ENCOUNTER — Ambulatory Visit (INDEPENDENT_AMBULATORY_CARE_PROVIDER_SITE_OTHER): Payer: Medicare Other | Admitting: Internal Medicine

## 2022-02-12 ENCOUNTER — Encounter: Payer: Self-pay | Admitting: Internal Medicine

## 2022-02-12 VITALS — BP 126/70 | HR 79 | Temp 98.4°F | Ht 68.0 in | Wt 176.0 lb

## 2022-02-12 DIAGNOSIS — J309 Allergic rhinitis, unspecified: Secondary | ICD-10-CM | POA: Diagnosis not present

## 2022-02-12 DIAGNOSIS — R42 Dizziness and giddiness: Secondary | ICD-10-CM

## 2022-02-12 DIAGNOSIS — I10 Essential (primary) hypertension: Secondary | ICD-10-CM

## 2022-02-12 MED ORDER — MECLIZINE HCL 25 MG PO TABS
12.5000 mg | ORAL_TABLET | Freq: Three times a day (TID) | ORAL | 1 refills | Status: DC | PRN
Start: 2022-02-12 — End: 2023-07-21

## 2022-02-12 NOTE — Assessment & Plan Note (Signed)
Exam benign, but has near debilitating symptoms now daily; will refill meclizine prn, but also ENT referral, head MRI with attention to middle ears, and PT referral vestibular rehab

## 2022-02-12 NOTE — Progress Notes (Signed)
Patient ID: Joseph Walker, male   DOB: 26-Oct-1950, 71 y.o.   MRN: [id number]        Chief Complaint: follow up persistent vertigo, htn, allergies       HPI:  Joseph Walker is a 71 y.o. male here unfortunately with 2 months gradually increased freq and severity of recurrent dizziness vertigo like with head position change, worse to turn over at night lying flat.  Has been taking increasing meclizine now tid and even at bedtime. This does help, but doesn't last.   Has been healthy "all my life" but now incapacitated mostly due to the symptoms.  Head CT neg for acute Sept 2023 at ED visit.  No trauma, fever HA and  Pt denies polydipsia, polyuria, or new focal neuro s/s.   Has mild allergy symptoms but not worse.  Pt denies chest pain, increased sob or doe, wheezing, orthopnea, PND, increased LE swelling, palpitations, or syncope.   Pt denies fever, wt loss, night sweats, loss of appetite, or other constitutional symptoms         Wt Readings from Last 3 Encounters:  02/12/22 176 lb (79.8 kg)  11/26/21 175 lb (79.4 kg)  11/11/21 174 lb (78.9 kg)   BP Readings from Last 3 Encounters:  02/12/22 126/70  11/26/21 120/74  11/11/21 (!) 129/92         Past Medical History:  Diagnosis Date   Diabetes mellitus without complication (HCC)    Glaucoma    Hyperlipemia    Hypertension    Tachycardia    Past Surgical History:  Procedure Laterality Date   TUMOR REMOVAL Left    Left Breast    reports that he has never smoked. He has never been exposed to tobacco smoke. He has never used smokeless tobacco. He reports current alcohol use of about 2.0 standard drinks of alcohol per week. He reports that he does not use drugs. family history includes Alzheimer's disease in his maternal grandfather, maternal grandmother, and mother; Cataracts in his mother; Glaucoma in his mother; Lung disease in his father. No Known Allergies Current Outpatient Medications on File Prior to Visit  Medication Sig Dispense  Refill   aspirin EC 81 MG tablet Take 1 tablet (81 mg total) by mouth daily. 90 tablet 1   atorvastatin (LIPITOR) 40 MG tablet Take 1 tablet (40 mg total) by mouth daily. 90 tablet 1   cetirizine (ZYRTEC) 10 MG tablet Take 1 tablet (10 mg total) by mouth daily. 30 tablet 11   Continuous Blood Gluc Receiver (FREESTYLE LIBRE 2 READER) DEVI 1 Act by Does not apply route daily. 2 each 5   Continuous Blood Gluc Sensor (FREESTYLE LIBRE 2 SENSOR) MISC USE DAILY 2 each 5   Dapagliflozin Pro-metFORMIN ER (XIGDUO XR) 12-998 MG TB24 TAKE 1 TABLET BY MOUTH DAILY 90 tablet 0   diltiazem (CARDIZEM CD) 240 MG 24 hr capsule Take 240 mg by mouth daily.     guaiFENesin (MUCINEX) 600 MG 12 hr tablet Take 2 tablets (1,200 mg total) by mouth 2 (two) times daily as needed. 60 tablet 1   insulin glargine, 2 Unit Dial, (TOUJEO MAX SOLOSTAR) 300 UNIT/ML Solostar Pen Inject 20 Units into the skin daily. 6 mL 1   Insulin Pen Needle (BD PEN NEEDLE MICRO U/F) 32G X 6 MM MISC USE DAILY 100 each 1   losartan (COZAAR) 100 MG tablet Take 100 mg by mouth daily.     Metoprolol Succinate 25 MG CS24 Take by mouth.  spironolactone (ALDACTONE) 25 MG tablet Take 25 mg by mouth daily.     triamcinolone (NASACORT) 55 MCG/ACT AERO nasal inhaler Place 2 sprays into the nose daily. 1 each 12   vortioxetine HBr (TRINTELLIX) 5 MG TABS tablet Take 1 tablet (5 mg total) by mouth daily. 30 tablet 0   insulin aspart (NOVOLOG) 100 UNIT/ML injection Inject 5 Units into the skin 2 (two) times daily with a meal. (Patient not taking: Reported on 02/12/2022) 10 mL PRN   No current facility-administered medications on file prior to visit.        ROS:  All others reviewed and negative.  Objective        PE:  BP 126/70 (BP Location: Right Arm, Patient Position: Sitting, Cuff Size: Large)   Pulse 79   Temp 98.4 F (36.9 C) (Oral)   Ht 5\' 8"  (1.727 m)   Wt 176 lb (79.8 kg)   SpO2 96%   BMI 26.76 kg/m                 Constitutional: Pt  appears in NAD               HENT: Head: NCAT.                Right Ear: External ear normal.                 Left Ear: External ear normal.                Eyes: . Pupils are equal, round, and reactive to light. Conjunctivae and EOM are normal               Nose: without d/c or deformity               Neck: Neck supple. Gross normal ROM               Cardiovascular: Normal rate and regular rhythm.                 Pulmonary/Chest: Effort normal and breath sounds without rales or wheezing.                Abd:  Soft, NT, ND, + BS, no organomegaly               Neurological: Pt is alert. At baseline orientation, motor grossly intact. Cn 2-12 intact               Skin: Skin is warm. No rashes, no other new lesions, LE edema - none               Psychiatric: Pt behavior is normal without agitation   Micro: none  Cardiac tracings I have personally interpreted today:  none  Pertinent Radiological findings (summarize): none   Lab Results  Component Value Date   WBC 6.5 11/11/2021   HGB 14.1 11/11/2021   HCT 43.2 11/11/2021   PLT 187 11/11/2021   GLUCOSE 167 (H) 11/11/2021   CHOL 144 06/20/2021   TRIG 278.0 (H) 06/20/2021   HDL 47.80 06/20/2021   LDLDIRECT 65.0 06/20/2021   ALT 12 08/28/2021   AST 19 08/28/2021   NA 135 11/11/2021   K 3.9 11/11/2021   CL 101 11/11/2021   CREATININE 1.19 11/11/2021   BUN 17 11/11/2021   CO2 24 11/11/2021   TSH 2.43 06/20/2021   PSA 2.25 07/04/2020   HGBA1C 8.9 (H) 08/28/2021   MICROALBUR <0.7 06/20/2021   Assessment/Plan:  Joseph Walker is a 71 y.o. Black or African American [2] male with  has a past medical history of Diabetes mellitus without complication (HCC), Glaucoma, Hyperlipemia, Hypertension, and Tachycardia.  Vertigo Exam benign, but has near debilitating symptoms now daily; will refill meclizine prn, but also ENT referral, head MRI with attention to middle ears, and PT referral vestibular rehab  Primary hypertension BP Readings from  Last 3 Encounters:  02/12/22 126/70  11/26/21 120/74  11/11/21 (!) 129/92   Stable, pt to continue medical treatment card cd 240 qd, losartan 100 qd, toprol xl 25 qd   Allergic rhinitis Overall stable, continue zyrtec 10 mg qd prn  Followup: Return if symptoms worsen or fail to improve.  Oliver Barre, MD 02/12/2022 1:33 PM [facility]

## 2022-02-12 NOTE — Assessment & Plan Note (Signed)
BP Readings from Last 3 Encounters:  02/12/22 126/70  11/26/21 120/74  11/11/21 (!) 129/92   Stable, pt to continue medical treatment card cd 240 qd, losartan 100 qd, toprol xl 25 qd

## 2022-02-12 NOTE — Patient Instructions (Signed)
Please continue all other medications as before, and refills have been done if requested - meclizine  Please have the pharmacy call with any other refills you may need.  Please continue your efforts at being more active, low cholesterol diet, and weight control.  Please keep your appointments with your specialists as you may have planned  You will be contacted regarding the referral for: ENT, brain MRI, and Physical Therapy called [facility]

## 2022-02-12 NOTE — Assessment & Plan Note (Signed)
Overall stable, continue zyrtec 10 mg qd prn

## 2022-02-21 NOTE — Patient Instructions (Signed)
Health Maintenance, Male Adopting a healthy lifestyle and getting preventive care are important in promoting health and wellness. Ask your health care provider about: The right schedule for you to have regular tests and exams. Things you can do on your own to prevent diseases and keep yourself healthy. What should I know about diet, weight, and exercise? Eat a healthy diet  Eat a diet that includes plenty of vegetables, fruits, low-fat dairy products, and lean protein. Do not eat a lot of foods that are high in solid fats, added sugars, or sodium. Maintain a healthy weight Body mass index (BMI) is a measurement that can be used to identify possible weight problems. It estimates body fat based on height and weight. Your health care provider can help determine your BMI and help you achieve or maintain a healthy weight. Get regular exercise Get regular exercise. This is one of the most important things you can do for your health. Most adults should: Exercise for at least 150 minutes each week. The exercise should increase your heart rate and make you sweat (moderate-intensity exercise). Do strengthening exercises at least twice a week. This is in addition to the moderate-intensity exercise. Spend less time sitting. Even light physical activity can be beneficial. Watch cholesterol and blood lipids Have your blood tested for lipids and cholesterol at 70 years of age, then have this test every 5 years. You may need to have your cholesterol levels checked more often if: Your lipid or cholesterol levels are high. You are older than 70 years of age. You are at high risk for heart disease. What should I know about cancer screening? Many types of cancers can be detected early and may often be prevented. Depending on your health history and family history, you may need to have cancer screening at various ages. This may include screening for: Colorectal cancer. Prostate cancer. Skin cancer. Lung  cancer. What should I know about heart disease, diabetes, and high blood pressure? Blood pressure and heart disease High blood pressure causes heart disease and increases the risk of stroke. This is more likely to develop in people who have high blood pressure readings or are overweight. Talk with your health care provider about your target blood pressure readings. Have your blood pressure checked: Every 3-5 years if you are 18-39 years of age. Every year if you are 40 years old or older. If you are between the ages of 65 and 75 and are a current or former smoker, ask your health care provider if you should have a one-time screening for abdominal aortic aneurysm (AAA). Diabetes Have regular diabetes screenings. This checks your fasting blood sugar level. Have the screening done: Once every three years after age 45 if you are at a normal weight and have a low risk for diabetes. More often and at a younger age if you are overweight or have a high risk for diabetes. What should I know about preventing infection? Hepatitis B If you have a higher risk for hepatitis B, you should be screened for this virus. Talk with your health care provider to find out if you are at risk for hepatitis B infection. Hepatitis C Blood testing is recommended for: Everyone born from 1945 through 1965. Anyone with known risk factors for hepatitis C. Sexually transmitted infections (STIs) You should be screened each year for STIs, including gonorrhea and chlamydia, if: You are sexually active and are younger than 70 years of age. You are older than 70 years of age and your   health care provider tells you that you are at risk for this type of infection. Your sexual activity has changed since you were last screened, and you are at increased risk for chlamydia or gonorrhea. Ask your health care provider if you are at risk. Ask your health care provider about whether you are at high risk for HIV. Your health care provider  may recommend a prescription medicine to help prevent HIV infection. If you choose to take medicine to prevent HIV, you should first get tested for HIV. You should then be tested every 3 months for as long as you are taking the medicine. Follow these instructions at home: Alcohol use Do not drink alcohol if your health care provider tells you not to drink. If you drink alcohol: Limit how much you have to 0-2 drinks a day. Know how much alcohol is in your drink. In the [place] one drink equals one 12 oz bottle of beer (355 mL), one 5 oz glass of Ivet Guerrieri (148 mL), or one 1 oz glass of hard liquor (44 mL). Lifestyle Do not use any products that contain nicotine or tobacco. These products include cigarettes, chewing tobacco, and vaping devices, such as e-cigarettes. If you need help quitting, ask your health care provider. Do not use street drugs. Do not share needles. Ask your health care provider for help if you need support or information about quitting drugs. General instructions Schedule regular health, dental, and eye exams. Stay current with your vaccines. Tell your health care provider if: You often feel depressed. You have ever been abused or do not feel safe at home. Summary Adopting a healthy lifestyle and getting preventive care are important in promoting health and wellness. Follow your health care provider's instructions about healthy diet, exercising, and getting tested or screened for diseases. Follow your health care provider's instructions on monitoring your cholesterol and blood pressure. This information is not intended to replace advice given to you by your health care provider. Make sure you discuss any questions you have with your health care provider. Document Revised: 07/30/2020 Document Reviewed: 07/30/2020 [employer] Patient Education  2023 [employer]  

## 2022-02-21 NOTE — Progress Notes (Signed)
Subjective:   Joseph Walker is a 71 y.o. male who presents for an Initial Medicare Annual Wellness Visit. I connected with  Joseph Walker on 02/21/22 by a audio enabled telemedicine application and verified that I am speaking with the correct person using two identifiers.  Patient Location: Home  Provider Location: Home Office  I discussed the limitations of evaluation and management by telemedicine. The patient expressed understanding and agreed to proceed.  Review of Systems    Deferred to PCP       Objective:    There were no vitals filed for this visit. There is no height or weight on file to calculate BMI.     11/11/2021    8:51 AM  Advanced Directives  Does Patient Have a Medical Advance Directive? No  Would patient like information on creating a medical advance directive? No - Patient declined    Current Medications (verified) Outpatient Encounter Medications as of 02/24/2022  Medication Sig   aspirin EC 81 MG tablet Take 1 tablet (81 mg total) by mouth daily.   atorvastatin (LIPITOR) 40 MG tablet Take 1 tablet (40 mg total) by mouth daily.   cetirizine (ZYRTEC) 10 MG tablet Take 1 tablet (10 mg total) by mouth daily.   Continuous Blood Gluc Receiver (FREESTYLE LIBRE 2 READER) DEVI 1 Act by Does not apply route daily.   Continuous Blood Gluc Sensor (FREESTYLE LIBRE 2 SENSOR) MISC USE DAILY   Dapagliflozin Pro-metFORMIN ER (XIGDUO XR) 12-998 MG TB24 TAKE 1 TABLET BY MOUTH DAILY   diltiazem (CARDIZEM CD) 240 MG 24 hr capsule Take 240 mg by mouth daily.   guaiFENesin (MUCINEX) 600 MG 12 hr tablet Take 2 tablets (1,200 mg total) by mouth 2 (two) times daily as needed.   insulin aspart (NOVOLOG) 100 UNIT/ML injection Inject 5 Units into the skin 2 (two) times daily with a meal. (Patient not taking: Reported on 02/12/2022)   insulin glargine, 2 Unit Dial, (TOUJEO MAX SOLOSTAR) 300 UNIT/ML Solostar Pen Inject 20 Units into the skin daily.   Insulin Pen Needle (BD PEN  NEEDLE MICRO U/F) 32G X 6 MM MISC USE DAILY   losartan (COZAAR) 100 MG tablet Take 100 mg by mouth daily.   meclizine (ANTIVERT) 25 MG tablet Take 0.5 tablets (12.5 mg total) by mouth 3 (three) times daily as needed for dizziness.   Metoprolol Succinate 25 MG CS24 Take by mouth.   spironolactone (ALDACTONE) 25 MG tablet Take 25 mg by mouth daily.   triamcinolone (NASACORT) 55 MCG/ACT AERO nasal inhaler Place 2 sprays into the nose daily.   vortioxetine HBr (TRINTELLIX) 5 MG TABS tablet Take 1 tablet (5 mg total) by mouth daily.   No facility-administered encounter medications on file as of 02/24/2022.    Allergies (verified) Patient has no known allergies.   History: Past Medical History:  Diagnosis Date   Diabetes mellitus without complication (HCC)    Glaucoma    Hyperlipemia    Hypertension    Tachycardia    Past Surgical History:  Procedure Laterality Date   TUMOR REMOVAL Left    Left Breast   Family History  Problem Relation Age of Onset   Glaucoma Mother    Cataracts Mother    Alzheimer's disease Mother    Lung disease Father    Alzheimer's disease Maternal Grandmother    Alzheimer's disease Maternal Grandfather    Social History   Socioeconomic History   Marital status: Single    Spouse name: Not  on file   Number of children: Not on file   Years of education: Not on file   Highest education level: Not on file  Occupational History   Not on file  Tobacco Use   Smoking status: Never    Passive exposure: Never   Smokeless tobacco: Never  Vaping Use   Vaping Use: Never used  Substance and Sexual Activity   Alcohol use: Yes    Alcohol/week: 2.0 standard drinks of alcohol    Types: 2 Cans of beer per week   Drug use: Never   Sexual activity: Yes    Partners: Female  Other Topics Concern   Not on file  Social History Narrative   Not on file   Social Determinants of Health   Financial Resource Strain: Not on file  Food Insecurity: Not on file   Transportation Needs: Not on file  Physical Activity: Not on file  Stress: Not on file  Social Connections: Not on file    Tobacco Counseling Counseling given: Not Answered   Clinical Intake:                 Diabetic?Yes Nutrition Risk Assessment:  Has the patient had any N/V/D within the last 2 months?  {YES/NO:21197} Does the patient have any non-healing wounds?  {YES/NO:21197} Has the patient had any unintentional weight loss or weight gain?  {YES/NO:21197}  Diabetes:  Is the patient diabetic?  {YES/NO:21197} If diabetic, was a CBG obtained today?  {YES/NO:21197} Did the patient bring in their glucometer from home?  {YES/NO:21197} How often do you monitor your CBG's? ***.   Financial Strains and Diabetes Management:  Are you having any financial strains with the device, your supplies or your medication? {YES/NO:21197}.  Does the patient want to be seen by Chronic Care Management for management of their diabetes?  {YES/NO:21197} Would the patient like to be referred to a Nutritionist or for Diabetic Management?  {YES/NO:21197}  Diabetic Exams:  Diabetic Eye Exam: Overdue for diabetic eye exam. Pt has been advised about the importance in completing this exam. Patient advised to call and schedule an eye exam. Diabetic Foot Exam: Completed 06/20/21          Activities of Daily Living     No data to display           Patient Care Team: Etta Grandchild, MD as PCP - General (Internal Medicine)  Indicate any recent Medical Services you may have received from other than [facility] providers in the past year (date may be approximate).     Assessment:   This is a routine wellness examination for Joseph Walker.  Hearing/Vision screen No results found.  Dietary issues and exercise activities discussed:     Goals Addressed   None   Depression Screen    02/12/2022    1:05 PM 11/26/2021    2:42 PM 08/28/2021   10:10 AM 06/20/2021    2:51 PM 07/04/2020    4:07  PM 07/04/2020    2:34 PM  PHQ 2/9 Scores  PHQ - 2 Score   6 3 3 2   PHQ- 9 Score   20 10 5    Exception Documentation Patient refusal Patient refusal        Fall Risk    11/26/2021    2:43 PM 08/28/2021   10:05 AM 07/04/2020    2:35 PM  Fall Risk   Falls in the past year? 0 0 0  Number falls in past yr:   0  Injury with Fall? 0  0  Risk for fall due to : No Fall Risks    Follow up Falls evaluation completed      FALL RISK PREVENTION PERTAINING TO THE HOME:  Any stairs in or around the home? {YES/NO:21197} If so, are there any without handrails? {YES/NO:21197} Home free of loose throw rugs in walkways, pet beds, electrical cords, etc? {YES/NO:21197} Adequate lighting in your home to reduce risk of falls? {YES/NO:21197}  ASSISTIVE DEVICES UTILIZED TO PREVENT FALLS:  Life alert? {YES/NO:21197} Use of a cane, walker or w/c? {YES/NO:21197} Grab bars in the bathroom? {YES/NO:21197} Shower chair or bench in shower? {YES/NO:21197} Elevated toilet seat or a handicapped toilet? {YES/NO:21197}  Cognitive Function:        Immunizations Immunization History  Administered Date(s) Administered   PFIZER(Purple Top)SARS-COV-2 Vaccination 05/30/2019, 07/05/2019    TDAP status: Due, Education has been provided regarding the importance of this vaccine. Advised may receive this vaccine at local pharmacy or Health Dept. Aware to provide a copy of the vaccination record if obtained from local pharmacy or Health Dept. Verbalized acceptance and understanding.  Flu Vaccine status: Due, Education has been provided regarding the importance of this vaccine. Advised may receive this vaccine at local pharmacy or Health Dept. Aware to provide a copy of the vaccination record if obtained from local pharmacy or Health Dept. Verbalized acceptance and understanding.  Pneumococcal vaccine status: Due, Education has been provided regarding the importance of this vaccine. Advised may receive this vaccine at  local pharmacy or Health Dept. Aware to provide a copy of the vaccination record if obtained from local pharmacy or Health Dept. Verbalized acceptance and understanding.  Covid-19 vaccine status: Information provided on how to obtain vaccines.   Qualifies for Shingles Vaccine? Yes   Zostavax completed No   Shingrix Completed?: No.    Education has been provided regarding the importance of this vaccine. Patient has been advised to call insurance company to determine out of pocket expense if they have not yet received this vaccine. Advised may also receive vaccine at local pharmacy or Health Dept. Verbalized acceptance and understanding.  Screening Tests Health Maintenance  Topic Date Due   OPHTHALMOLOGY EXAM  Never done   DTaP/Tdap/Td (1 - Tdap) Never done   COLONOSCOPY (Pts 45-11yrs Insurance coverage will need to be confirmed)  Never done   Zoster Vaccines- Shingrix (1 of 2) Never done   Pneumonia Vaccine 47+ Years old (1 - PCV) Never done   INFLUENZA VACCINE  Never done   COVID-19 Vaccine (3 - 2023-24 season) 11/22/2021   HEMOGLOBIN A1C  02/27/2022   Diabetic kidney evaluation - Urine ACR  06/21/2022   FOOT EXAM  06/21/2022   Diabetic kidney evaluation - GFR measurement  11/12/2022   Medicare Annual Wellness (AWV)  02/25/2023   Hepatitis C Screening  Completed   HPV VACCINES  Aged Out    Health Maintenance  Health Maintenance Due  Topic Date Due   OPHTHALMOLOGY EXAM  Never done   DTaP/Tdap/Td (1 - Tdap) Never done   COLONOSCOPY (Pts 45-60yrs Insurance coverage will need to be confirmed)  Never done   Zoster Vaccines- Shingrix (1 of 2) Never done   Pneumonia Vaccine 19+ Years old (1 - PCV) Never done   INFLUENZA VACCINE  Never done   COVID-19 Vaccine (3 - 2023-24 season) 11/22/2021    {Colorectal cancer screening:2101809}  Lung Cancer Screening: (Low Dose CT Chest recommended if Age 63-80 years, 30 pack-year currently smoking OR have  quit w/in 15years.) does not qualify.    Additional Screening:  Hepatitis C Screening: does qualify; Completed 07/04/20  Vision Screening: Recommended annual ophthalmology exams for early detection of glaucoma and other disorders of the eye. Is the patient up to date with their annual eye exam?  {YES/NO:21197} Who is the provider or what is the name of the office in which the patient attends annual eye exams? *** If pt is not established with a provider, would they like to be referred to a provider to establish care? {YES/NO:21197}.   Dental Screening: Recommended annual dental exams for proper oral hygiene  Community Resource Referral / Chronic Care Management: CRR required this visit?  {YES/NO:21197}  CCM required this visit?  {YES/NO:21197}     Plan:     I have personally reviewed and noted the following in the patient's chart:   Medical and social history Use of alcohol, tobacco or illicit drugs  Current medications and supplements including opioid prescriptions. Patient is not currently taking opioid prescriptions. Functional ability and status Nutritional status Physical activity Advanced directives List of other physicians Hospitalizations, surgeries, and ER visits in previous 12 months Vitals Screenings to include cognitive, depression, and falls Referrals and appointments  In addition, I have reviewed and discussed with patient certain preventive protocols, quality metrics, and best practice recommendations. A written personalized care plan for preventive services as well as general preventive health recommendations were provided to patient.     Wanda Plump, RN   02/21/2022   Nurse Notes:  Mr. Holk , Thank you for taking time to come for your Medicare Wellness Visit. I appreciate your ongoing commitment to your health goals. Please review the following plan we discussed and let me know if I can assist you in the future.   These are the goals we discussed:  Goals   None     This is a list of the  screening recommended for you and due dates:  Health Maintenance  Topic Date Due   Eye exam for diabetics  Never done   DTaP/Tdap/Td vaccine (1 - Tdap) Never done   Colon Cancer Screening  Never done   Zoster (Shingles) Vaccine (1 of 2) Never done   Pneumonia Vaccine (1 - PCV) Never done   Flu Shot  Never done   COVID-19 Vaccine (3 - 2023-24 season) 11/22/2021   Hemoglobin A1C  02/27/2022   Yearly kidney health urinalysis for diabetes  06/21/2022   Complete foot exam   06/21/2022   Yearly kidney function blood test for diabetes  11/12/2022   Medicare Annual Wellness Visit  02/25/2023   Hepatitis C Screening: USPSTF Recommendation to screen - Ages 18-79 yo.  Completed   HPV Vaccine  Aged Out

## 2022-02-24 ENCOUNTER — Ambulatory Visit: Payer: Medicare Other | Admitting: *Deleted

## 2022-02-24 DIAGNOSIS — Z0000 Encounter for general adult medical examination without abnormal findings: Secondary | ICD-10-CM

## 2022-02-27 ENCOUNTER — Encounter: Payer: Self-pay | Admitting: Internal Medicine

## 2022-02-27 ENCOUNTER — Ambulatory Visit (INDEPENDENT_AMBULATORY_CARE_PROVIDER_SITE_OTHER): Payer: Medicare Other | Admitting: Internal Medicine

## 2022-02-27 VITALS — BP 144/88 | HR 81 | Temp 98.0°F | Resp 16 | Ht 68.0 in | Wt 177.0 lb

## 2022-02-27 DIAGNOSIS — E139 Other specified diabetes mellitus without complications: Secondary | ICD-10-CM

## 2022-02-27 DIAGNOSIS — E785 Hyperlipidemia, unspecified: Secondary | ICD-10-CM | POA: Diagnosis not present

## 2022-02-27 DIAGNOSIS — I10 Essential (primary) hypertension: Secondary | ICD-10-CM | POA: Diagnosis not present

## 2022-02-27 LAB — BASIC METABOLIC PANEL
BUN: 15 mg/dL (ref 6–23)
CO2: 27 mEq/L (ref 19–32)
Calcium: 9.6 mg/dL (ref 8.4–10.5)
Chloride: 101 mEq/L (ref 96–112)
Creatinine, Ser: 1.14 mg/dL (ref 0.40–1.50)
GFR: 65.3 mL/min (ref >60.00)
Glucose, Bld: 142 mg/dL — ABNORMAL HIGH (ref 70–99)
Potassium: 3.8 mEq/L (ref 3.5–5.1)
Sodium: 136 mEq/L (ref 135–145)

## 2022-02-27 LAB — HEMOGLOBIN A1C: Hgb A1c MFr Bld: 7.7 % — ABNORMAL HIGH (ref 4.6–6.5)

## 2022-02-27 NOTE — Progress Notes (Signed)
Subjective:  Patient ID: Joseph Walker, male    DOB: [birth year]  Age: 71 y.o. MRN: [id number]  CC: Hypertension and Diabetes   HPI Joseph Walker presents for f/up -  The dizziness has resolved.  He is active and denies chest pain, shortness of breath, diaphoresis, or edema.  He refuses all vaccines today.  Outpatient Medications Prior to Visit  Medication Sig Dispense Refill   aspirin EC 81 MG tablet Take 1 tablet (81 mg total) by mouth daily. 90 tablet 1   cetirizine (ZYRTEC) 10 MG tablet Take 1 tablet (10 mg total) by mouth daily. 30 tablet 11   Continuous Blood Gluc Sensor (FREESTYLE LIBRE 2 SENSOR) MISC USE DAILY 2 each 5   Dapagliflozin Pro-metFORMIN ER (XIGDUO XR) 12-998 MG TB24 TAKE 1 TABLET BY MOUTH DAILY 90 tablet 0   diltiazem (CARDIZEM CD) 240 MG 24 hr capsule Take 240 mg by mouth daily.     guaiFENesin (MUCINEX) 600 MG 12 hr tablet Take 2 tablets (1,200 mg total) by mouth 2 (two) times daily as needed. 60 tablet 1   Insulin Pen Needle (BD PEN NEEDLE MICRO U/F) 32G X 6 MM MISC USE DAILY 100 each 1   losartan (COZAAR) 100 MG tablet Take 100 mg by mouth daily.     meclizine (ANTIVERT) 25 MG tablet Take 0.5 tablets (12.5 mg total) by mouth 3 (three) times daily as needed for dizziness. 60 tablet 1   Metoprolol Succinate 25 MG CS24 Take by mouth.     spironolactone (ALDACTONE) 25 MG tablet Take 25 mg by mouth daily.     triamcinolone (NASACORT) 55 MCG/ACT AERO nasal inhaler Place 2 sprays into the nose daily. 1 each 12   atorvastatin (LIPITOR) 40 MG tablet Take 1 tablet (40 mg total) by mouth daily. 90 tablet 1   Continuous Blood Gluc Receiver (FREESTYLE LIBRE 2 READER) DEVI 1 Act by Does not apply route daily. 2 each 5   insulin aspart (NOVOLOG) 100 UNIT/ML injection Inject 5 Units into the skin 2 (two) times daily with a meal. 10 mL PRN   insulin glargine, 2 Unit Dial, (TOUJEO MAX SOLOSTAR) 300 UNIT/ML Solostar Pen Inject 20 Units into the skin daily. 6 mL 1   vortioxetine  HBr (TRINTELLIX) 5 MG TABS tablet Take 1 tablet (5 mg total) by mouth daily. 30 tablet 0   No facility-administered medications prior to visit.    ROS Review of Systems  Constitutional: Negative.  Negative for diaphoresis and fatigue.  HENT: Negative.    Eyes: Negative.   Respiratory:  Negative for cough, chest tightness, shortness of breath and wheezing.   Cardiovascular:  Negative for chest pain, palpitations and leg swelling.  Gastrointestinal:  Negative for abdominal pain, diarrhea, nausea and vomiting.  Endocrine: Negative.   Genitourinary:  Negative for difficulty urinating and urgency.  Musculoskeletal: Negative.  Negative for myalgias.  Skin: Negative.   Neurological: Negative.  Negative for dizziness, weakness and light-headedness.  Hematological: Negative.  Negative for adenopathy. Does not bruise/bleed easily.  Psychiatric/Behavioral: Negative.      Objective:  BP (!) 144/88 (BP Location: Left Arm, Patient Position: Sitting, Cuff Size: Large)   Pulse 81   Temp 98 F (36.7 C) (Oral)   Resp 16   Ht 5\' 8"  (1.727 m)   Wt 177 lb (80.3 kg)   SpO2 97%   BMI 26.91 kg/m   BP Readings from Last 3 Encounters:  02/27/22 (!) 144/88  02/12/22 126/70  11/26/21 120/74  Wt Readings from Last 3 Encounters:  02/27/22 177 lb (80.3 kg)  02/12/22 176 lb (79.8 kg)  11/26/21 175 lb (79.4 kg)    Physical Exam Vitals reviewed.  HENT:     Nose: Nose normal.     Mouth/Throat:     Mouth: Mucous membranes are moist.  Eyes:     General: No scleral icterus.    Conjunctiva/sclera: Conjunctivae normal.  Cardiovascular:     Rate and Rhythm: Normal rate and regular rhythm.     Heart sounds: No murmur heard. Pulmonary:     Effort: Pulmonary effort is normal.     Breath sounds: No stridor. No wheezing, rhonchi or rales.  Abdominal:     General: Abdomen is flat.     Palpations: There is no mass.     Tenderness: There is no abdominal tenderness. There is no guarding.     Hernia:  No hernia is present.  Musculoskeletal:        General: Normal range of motion.     Cervical back: Neck supple.     Right lower leg: No edema.     Left lower leg: No edema.  Lymphadenopathy:     Cervical: No cervical adenopathy.  Skin:    General: Skin is warm and dry.  Neurological:     General: No focal deficit present.     Mental Status: He is alert.  Psychiatric:        Mood and Affect: Mood normal.        Behavior: Behavior normal.     Lab Results  Component Value Date   WBC 6.5 11/11/2021   HGB 14.1 11/11/2021   HCT 43.2 11/11/2021   PLT 187 11/11/2021   GLUCOSE 142 (H) 02/27/2022   CHOL 144 06/20/2021   TRIG 278.0 (H) 06/20/2021   HDL 47.80 06/20/2021   LDLDIRECT 65.0 06/20/2021   ALT 12 08/28/2021   AST 19 08/28/2021   NA 136 02/27/2022   K 3.8 02/27/2022   CL 101 02/27/2022   CREATININE 1.14 02/27/2022   BUN 15 02/27/2022   CO2 27 02/27/2022   TSH 2.43 06/20/2021   PSA 2.25 07/04/2020   HGBA1C 7.7 (H) 02/27/2022   MICROALBUR <0.7 06/20/2021    CT Head Wo Contrast  Result Date: 11/11/2021 CLINICAL DATA:  Persistent dizziness. EXAM: CT HEAD WITHOUT CONTRAST TECHNIQUE: Contiguous axial images were obtained from the base of the skull through the vertex without intravenous contrast. RADIATION DOSE REDUCTION: This exam was performed according to the departmental dose-optimization program which includes automated exposure control, adjustment of the mA and/or kV according to patient size and/or use of iterative reconstruction technique. COMPARISON:  None Available. FINDINGS: Brain: No evidence of acute infarction, hemorrhage, hydrocephalus, extra-axial collection or mass lesion/mass effect. Vascular: No hyperdense vessel or unexpected calcification. Skull: Normal. Negative for fracture or focal lesion. Sinuses/Orbits: No acute finding. Other: None. IMPRESSION: Negative head CT. Electronically Signed   By: Richarda Overlie M.D.   On: 11/11/2021 10:15    Assessment & Plan:    Jashon was seen today for hypertension and diabetes.  Diagnoses and all orders for this visit:  Primary hypertension- His blood pressure is not adequately well-controlled.  He will try to improve compliance with antihypertensives. -     Basic metabolic panel; Future -     Basic metabolic panel  Diabetes 1.5, managed as type 1 (HCC)- His A1c has improved down to 7.7%.  I recommended that he continue basal insulin, metformin, and the  SGLT2 inhibitor. -     Basic metabolic panel; Future -     Hemoglobin A1c; Future -     Hemoglobin A1c -     Basic metabolic panel -     Ambulatory referral to Ophthalmology -     insulin glargine, 2 Unit Dial, (TOUJEO MAX SOLOSTAR) 300 UNIT/ML Solostar Pen; Inject 20 Units into the skin daily. -     Continuous Blood Gluc Receiver (FREESTYLE LIBRE 2 READER) DEVI; 1 Act by Does not apply route daily.  Hyperlipidemia LDL goal <70- LDL goal achieved. Doing well on the statin  -     atorvastatin (LIPITOR) 40 MG tablet; Take 1 tablet (40 mg total) by mouth daily.   I have discontinued Claborn B. Rubis's insulin aspart and vortioxetine HBr. I am also having him maintain his diltiazem, losartan, Metoprolol Succinate, aspirin EC, BD Pen Needle Micro U/F, spironolactone, cetirizine, triamcinolone, guaiFENesin, FreeStyle Libre 2 Sensor, Xigduo XR, meclizine, Toujeo Max SoloStar, [employer] 2 Reader, and atorvastatin.  Meds ordered this encounter  Medications   insulin glargine, 2 Unit Dial, (TOUJEO MAX SOLOSTAR) 300 UNIT/ML Solostar Pen    Sig: Inject 20 Units into the skin daily.    Dispense:  6 mL    Refill:  1   Continuous Blood Gluc Receiver (FREESTYLE LIBRE 2 READER) DEVI    Sig: 1 Act by Does not apply route daily.    Dispense:  2 each    Refill:  5   atorvastatin (LIPITOR) 40 MG tablet    Sig: Take 1 tablet (40 mg total) by mouth daily.    Dispense:  90 tablet    Refill:  1     Follow-up: Return in about 6 months (around 08/29/2022).  Sanda Linger, MD

## 2022-02-27 NOTE — Patient Instructions (Signed)

## 2022-03-01 ENCOUNTER — Encounter: Payer: Self-pay | Admitting: Internal Medicine

## 2022-03-01 MED ORDER — FREESTYLE LIBRE 2 READER DEVI: 1.0000 | Freq: Every day | 5 refills | Status: DC

## 2022-03-01 MED ORDER — TOUJEO MAX SOLOSTAR 300 UNIT/ML ~~LOC~~ SOPN: 20.0000 [IU] | PEN_INJECTOR | Freq: Every day | SUBCUTANEOUS | 1 refills | Status: DC

## 2022-03-01 MED ORDER — ATORVASTATIN CALCIUM 40 MG PO TABS: 40.0000 mg | ORAL_TABLET | Freq: Every day | ORAL | 1 refills | Status: DC

## 2022-03-04 ENCOUNTER — Ambulatory Visit: Payer: Medicare Other | Attending: Physical Therapy | Admitting: Physical Therapy

## 2022-05-06 ENCOUNTER — Encounter: Payer: Self-pay | Admitting: Internal Medicine

## 2022-05-06 ENCOUNTER — Ambulatory Visit: Payer: Medicare Other | Admitting: Internal Medicine

## 2022-05-06 ENCOUNTER — Ambulatory Visit (INDEPENDENT_AMBULATORY_CARE_PROVIDER_SITE_OTHER): Payer: Medicare Other | Admitting: Internal Medicine

## 2022-05-06 VITALS — BP 126/76 | HR 84 | Temp 98.3°F | Ht 68.0 in | Wt 179.0 lb

## 2022-05-06 DIAGNOSIS — E785 Hyperlipidemia, unspecified: Secondary | ICD-10-CM | POA: Diagnosis not present

## 2022-05-06 DIAGNOSIS — E139 Other specified diabetes mellitus without complications: Secondary | ICD-10-CM | POA: Diagnosis not present

## 2022-05-06 DIAGNOSIS — N6315 Unspecified lump in the right breast, overlapping quadrants: Secondary | ICD-10-CM | POA: Diagnosis not present

## 2022-05-06 DIAGNOSIS — I10 Essential (primary) hypertension: Secondary | ICD-10-CM | POA: Diagnosis not present

## 2022-05-06 DIAGNOSIS — N182 Chronic kidney disease, stage 2 (mild): Secondary | ICD-10-CM | POA: Diagnosis not present

## 2022-05-06 DIAGNOSIS — N1831 Chronic kidney disease, stage 3a: Secondary | ICD-10-CM | POA: Diagnosis not present

## 2022-05-06 LAB — CBC WITH DIFFERENTIAL/PLATELET
Basophils Absolute: 0 10*3/uL (ref 0.0–0.1)
Basophils Relative: 0.7 % (ref 0.0–3.0)
Eosinophils Absolute: 0.3 10*3/uL (ref 0.0–0.7)
Eosinophils Relative: 4.1 % (ref 0.0–5.0)
HCT: 45 % (ref 39.0–52.0)
Hemoglobin: 14.8 g/dL (ref 13.0–17.0)
Lymphocytes Relative: 34.5 % (ref 12.0–46.0)
Lymphs Abs: 2.1 10*3/uL (ref 0.7–4.0)
MCHC: 33 g/dL (ref 30.0–36.0)
MCV: 78.7 fl (ref 78.0–100.0)
Monocytes Absolute: 0.7 10*3/uL (ref 0.1–1.0)
Monocytes Relative: 12.1 % — ABNORMAL HIGH (ref 3.0–12.0)
Neutro Abs: 3 10*3/uL (ref 1.4–7.7)
Neutrophils Relative %: 48.6 % (ref 43.0–77.0)
Platelets: 190 10*3/uL (ref 150.0–400.0)
RBC: 5.72 Mil/uL (ref 4.22–5.81)
RDW: 15.5 % (ref 11.5–15.5)
WBC: 6.1 10*3/uL (ref 4.0–10.5)

## 2022-05-06 LAB — LIPID PANEL
Cholesterol: 130 mg/dL (ref 0–200)
HDL: 39.7 mg/dL (ref >39.00)
NonHDL: 90.49
Total CHOL/HDL Ratio: 3
Triglycerides: 222 mg/dL — ABNORMAL HIGH (ref 0.0–149.0)
VLDL: 44.4 mg/dL — ABNORMAL HIGH (ref 0.0–40.0)

## 2022-05-06 LAB — MICROALBUMIN / CREATININE URINE RATIO
Creatinine,U: 99.1 mg/dL
Microalb Creat Ratio: 0.7 mg/g (ref 0.0–30.0)
Microalb, Ur: <0.7 mg/dL (ref 0.0–1.9)

## 2022-05-06 LAB — URINALYSIS, ROUTINE W REFLEX MICROSCOPIC
Bilirubin Urine: NEGATIVE
Hgb urine dipstick: NEGATIVE
Ketones, ur: NEGATIVE
Leukocytes,Ua: NEGATIVE
Nitrite: NEGATIVE
RBC / HPF: NONE SEEN (ref 0–?)
Specific Gravity, Urine: 1.02 (ref 1.000–1.030)
Total Protein, Urine: NEGATIVE
Urine Glucose: >=1000 — AB
Urobilinogen, UA: 0.2 (ref 0.0–1.0)
pH: 6 (ref 5.0–8.0)

## 2022-05-06 LAB — HEPATIC FUNCTION PANEL
ALT: 13 U/L (ref 0–53)
AST: 17 U/L (ref 0–37)
Albumin: 4.4 g/dL (ref 3.5–5.2)
Alkaline Phosphatase: 49 U/L (ref 39–117)
Bilirubin, Direct: 0.1 mg/dL (ref 0.0–0.3)
Total Bilirubin: 0.5 mg/dL (ref 0.2–1.2)
Total Protein: 7.4 g/dL (ref 6.0–8.3)

## 2022-05-06 LAB — BASIC METABOLIC PANEL
BUN: 22 mg/dL (ref 6–23)
CO2: 28 mEq/L (ref 19–32)
Calcium: 10.1 mg/dL (ref 8.4–10.5)
Chloride: 97 mEq/L (ref 96–112)
Creatinine, Ser: 1.38 mg/dL (ref 0.40–1.50)
GFR: 51.85 mL/min — ABNORMAL LOW (ref >60.00)
Glucose, Bld: 133 mg/dL — ABNORMAL HIGH (ref 70–99)
Potassium: 3.5 mEq/L (ref 3.5–5.1)
Sodium: 137 mEq/L (ref 135–145)

## 2022-05-06 LAB — LDL CHOLESTEROL, DIRECT: Direct LDL: 63 mg/dL

## 2022-05-06 MED ORDER — DAPAGLIFLOZIN PRO-METFORMIN ER 10-1000 MG PO TB24: 1.0000 | ORAL_TABLET | Freq: Every day | ORAL | 0 refills | Status: DC

## 2022-05-06 NOTE — Patient Instructions (Signed)
Gynecomastia, Adult Gynecomastia is an overgrowth of gland tissue in a man's breasts. This may cause one or both breasts to become enlarged. The condition often develops in men who have an imbalance of the male sex hormone (testosterone) and the male sex hormone (estrogen). This means that a man may have too much estrogen, too little testosterone, or both. Gynecomastia may be a normal part of aging for some men. It can also happen to adolescent boys during puberty. What are the causes? This condition may be caused by: Certain medicines, such as: Estrogen supplements and medicines that act like estrogen in the body. Medicines that keep testosterone from functioning normally in the body (testosterone-inhibiting drugs). Anabolic steroids. Medicines to treat heartburn, cancer, heart disease, mental health problems, HIV, or AIDS. Antibiotic medicine. Chemotherapy medicine. Recreational drugs, including alcohol, marijuana, and opioids. Herbal products, including lavender and tea tree oil. A gene that is passed from parent to child (inherited). Certain medical conditions, such as: Tumors in the pituitary or adrenal gland. An overactive thyroid gland. Certain inherited disorders, including a genetic disease that causes low testosterone in males (Klinefelter syndrome). Cancer of the lung, kidney, liver, testicle, or gastrointestinal tract. Conditions that cause liver or kidney failure. Poor nutrition and starvation. Testicle shrinking or failure (testicularatrophy). In some cases, the cause may not be known. What increases the risk? The following factors may make you more likely to develop this condition: Being 50 years old or older. Being overweight. Abusing alcohol or other drugs. Having a family history of gynecomastia. What are the signs or symptoms? In most cases, breast enlargement is the only symptom. The enlargement may start near the nipple, and the breast tissue may feel firm and  rubbery. Other symptoms may include: Pain or tenderness in the breasts. Itchy breasts. How is this diagnosed? This condition may be diagnosed based on: Your symptoms and medical history. A physical exam. Imaging tests, such as: An ultrasound. A mammogram. An MRI. Blood tests. Removal of a sample of breast tissue to be tested in a lab (biopsy). How is this treated? This condition may go away on its own, without treatment. If gynecomastia is caused by a medical problem or drug abuse, treatment may include: Getting treatment for the underlying medical problem or for drug abuse. Changing or stopping medicines. Medicines to block the effects of estrogen. Taking a testosterone replacement. Surgery to remove breast tissue or any lumps in your breasts. Breast reduction surgery. This may be an option if you have severe or painful gynecomastia. Follow these instructions at home:  Take over-the-counter and prescription medicines only as told by your health care provider. Talk to your health care provider before taking any herbal medicines or diet supplements. Do not abuse drugs or alcohol. Keep all follow-up visits as told by your health care provider. This is important. Contact a health care provider if: Your breast tissue grows larger or gets more swollen or painful. You have a lump in your testicle. You have blood or discharge coming from your nipples. Your nipple changes shape. You develop a hard or painful lump in your breast. Summary Gynecomastia is an overgrowth of gland tissue in a man's breasts. This may cause one or both breasts to become enlarged. In most cases, breast enlargement is the only symptom. The enlargement may start near the nipple, and the breast tissue may feel firm and rubbery. This condition may go away on its own, without treatment. In some cases, treatment for an underlying medical problem or for drug   abuse may be needed. Take over-the-counter and prescription  medicines only as told by your health care provider. Do not abuse drugs or alcohol. This information is not intended to replace advice given to you by your health care provider. Make sure you discuss any questions you have with your health care provider. Document Revised: 09/02/2018 Document Reviewed: 09/02/2018 Elsevier Patient Education  2023 [employer]  

## 2022-05-06 NOTE — Progress Notes (Signed)
Subjective:  Patient ID: Joseph Walker, male    DOB: February 02, 1951  Age: 72 y.o. MRN: [id number]  CC: Hypertension, Hyperlipidemia, and Diabetes   HPI ARON WOLFFE presents for f/up ----  He complains of a 57-monthhistory of right breast hypertrophy that is painful and feels swollen.  About 30 years ago he underwent left mastectomy for benign gynecomastia.  He underwent an endocrinological evaluation at that time.  Outpatient Medications Prior to Visit  Medication Sig Dispense Refill   aspirin EC 81 MG tablet Take 1 tablet (81 mg total) by mouth daily. 90 tablet 1   atorvastatin (LIPITOR) 40 MG tablet Take 1 tablet (40 mg total) by mouth daily. 90 tablet 1   cetirizine (ZYRTEC) 10 MG tablet Take 1 tablet (10 mg total) by mouth daily. 30 tablet 11   Continuous Blood Gluc Receiver (FREESTYLE LIBRE 2 READER) DEVI 1 Act by Does not apply route daily. 2 each 5   Continuous Blood Gluc Sensor (FREESTYLE LIBRE 2 SENSOR) MISC USE DAILY 2 each 5   diltiazem (CARDIZEM CD) 240 MG 24 hr capsule Take 240 mg by mouth daily.     guaiFENesin (MUCINEX) 600 MG 12 hr tablet Take 2 tablets (1,200 mg total) by mouth 2 (two) times daily as needed. 60 tablet 1   insulin glargine, 2 Unit Dial, (TOUJEO MAX SOLOSTAR) 300 UNIT/ML Solostar Pen Inject 20 Units into the skin daily. 6 mL 1   Insulin Pen Needle (BD PEN NEEDLE MICRO U/F) 32G X 6 MM MISC USE DAILY 100 each 1   losartan (COZAAR) 100 MG tablet Take 100 mg by mouth daily.     meclizine (ANTIVERT) 25 MG tablet Take 0.5 tablets (12.5 mg total) by mouth 3 (three) times daily as needed for dizziness. 60 tablet 1   Metoprolol Succinate 25 MG CS24 Take by mouth.     spironolactone (ALDACTONE) 25 MG tablet Take 25 mg by mouth daily.     triamcinolone (NASACORT) 55 MCG/ACT AERO nasal inhaler Place 2 sprays into the nose daily. 1 each 12   Dapagliflozin Pro-metFORMIN ER (XIGDUO XR) 12-998 MG TB24 TAKE 1 TABLET BY MOUTH DAILY 90 tablet 0   No facility-administered  medications prior to visit.    ROS Review of Systems  Constitutional: Negative.  Negative for diaphoresis and fatigue.  HENT: Negative.    Eyes: Negative.   Respiratory:  Negative for chest tightness, shortness of breath and wheezing.   Cardiovascular:  Negative for chest pain, palpitations and leg swelling.  Gastrointestinal:  Negative for abdominal pain, constipation, diarrhea, nausea and vomiting.  Endocrine: Negative.   Genitourinary: Negative.  Negative for difficulty urinating.  Musculoskeletal: Negative.   Skin: Negative.  Negative for color change.  Neurological: Negative.  Negative for dizziness and weakness.  Hematological:  Negative for adenopathy. Does not bruise/bleed easily.  Psychiatric/Behavioral: Negative.      Objective:  BP 126/76 (BP Location: Left Arm, Patient Position: Sitting, Cuff Size: Large)   Pulse 84   Temp 98.3 F (36.8 C) (Oral)   Ht 5' 8"$  (1.727 m)   Wt 179 lb (81.2 kg)   SpO2 94%   BMI 27.22 kg/m   BP Readings from Last 3 Encounters:  05/06/22 126/76  02/27/22 (!) 144/88  02/12/22 126/70    Wt Readings from Last 3 Encounters:  05/06/22 179 lb (81.2 kg)  02/27/22 177 lb (80.3 kg)  02/12/22 176 lb (79.8 kg)    Physical Exam Vitals reviewed.  Constitutional:  Appearance: He is not ill-appearing.  HENT:     Nose: Nose normal.     Mouth/Throat:     Mouth: Mucous membranes are moist.  Eyes:     General: No scleral icterus.    Conjunctiva/sclera: Conjunctivae normal.  Cardiovascular:     Rate and Rhythm: Normal rate and regular rhythm.     Heart sounds: No murmur heard. Pulmonary:     Effort: Pulmonary effort is normal.     Breath sounds: No stridor. No wheezing, rhonchi or rales.  Chest:     Chest wall: No mass.  Breasts:    Right: Swelling present. No bleeding, inverted nipple, mass, nipple discharge, skin change or tenderness.     Left: No swelling, bleeding, inverted nipple, mass, nipple discharge, skin change or  tenderness.     Comments: There is diffuse right gynecomastia with no dominant mass.  The areola and nipple are normal.  The overlying skin is normal.  There is no erythema, induration, or fluctuance. Abdominal:     General: Abdomen is flat.     Palpations: There is no mass.     Tenderness: There is no abdominal tenderness. There is no guarding.     Hernia: No hernia is present.  Musculoskeletal:        General: Normal range of motion.     Cervical back: Neck supple.     Right lower leg: No edema.     Left lower leg: No edema.  Lymphadenopathy:     Cervical: No cervical adenopathy.     Upper Body:     Right upper body: No supraclavicular, axillary or pectoral adenopathy.     Left upper body: No supraclavicular, axillary or pectoral adenopathy.  Skin:    General: Skin is warm and dry.     Findings: No rash.  Neurological:     General: No focal deficit present.     Mental Status: He is alert.  Psychiatric:        Mood and Affect: Mood normal.        Behavior: Behavior normal.     Lab Results  Component Value Date   WBC 6.1 05/06/2022   HGB 14.8 05/06/2022   HCT 45.0 05/06/2022   PLT 190.0 05/06/2022   GLUCOSE 133 (H) 05/06/2022   CHOL 130 05/06/2022   TRIG 222.0 (H) 05/06/2022   HDL 39.70 05/06/2022   LDLDIRECT 63.0 05/06/2022   ALT 13 05/06/2022   AST 17 05/06/2022   NA 137 05/06/2022   K 3.5 05/06/2022   CL 97 05/06/2022   CREATININE 1.38 05/06/2022   BUN 22 05/06/2022   CO2 28 05/06/2022   TSH 2.43 06/20/2021   PSA 2.25 07/04/2020   HGBA1C 7.7 (H) 02/27/2022   MICROALBUR <0.7 05/06/2022    CT Head Wo Contrast  Result Date: 11/11/2021 CLINICAL DATA:  Persistent dizziness. EXAM: CT HEAD WITHOUT CONTRAST TECHNIQUE: Contiguous axial images were obtained from the base of the skull through the vertex without intravenous contrast. RADIATION DOSE REDUCTION: This exam was performed according to the departmental dose-optimization program which includes automated  exposure control, adjustment of the mA and/or kV according to patient size and/or use of iterative reconstruction technique. COMPARISON:  None Available. FINDINGS: Brain: No evidence of acute infarction, hemorrhage, hydrocephalus, extra-axial collection or mass lesion/mass effect. Vascular: No hyperdense vessel or unexpected calcification. Skull: Normal. Negative for fracture or focal lesion. Sinuses/Orbits: No acute finding. Other: None. IMPRESSION: Negative head CT. Electronically Signed   By: Quita Skye  Anselm Pancoast M.D.   On: 11/11/2021 10:15    Assessment & Plan:   Mitsugi was seen today for hypertension, hyperlipidemia and diabetes.  Diagnoses and all orders for this visit:  Chronic renal disease, stage 2, mildly decreased glomerular filtration rate (GFR) between 60-89 mL/min/1.73 square meter -     Basic metabolic panel; Future -     Urinalysis, Routine w reflex microscopic; Future -     Microalbumin / creatinine urine ratio; Future -     Microalbumin / creatinine urine ratio -     Urinalysis, Routine w reflex microscopic -     Basic metabolic panel  Diabetes 1.5, managed as type 1 (HCC) -     Basic metabolic panel; Future -     Microalbumin / creatinine urine ratio; Future -     Microalbumin / creatinine urine ratio -     Basic metabolic panel -     Dapagliflozin Pro-metFORMIN ER (XIGDUO XR) 12-998 MG TB24; Take 1 tablet by mouth daily.  Primary hypertension- His BP is well controlled. -     CBC with Differential/Platelet; Future -     Hepatic function panel; Future -     Hepatic function panel -     CBC with Differential/Platelet  Hyperlipidemia LDL goal <70- LDL goal achieved. Doing well on the statin  -     Lipid panel; Future -     Hepatic function panel; Future -     Hepatic function panel -     Lipid panel  Mass overlapping multiple quadrants of right breast -     US BREAST LTD UNI RIGHT INC AXILLA; Future -     MM DIAG BREAST TOMO BILATERAL; Future  Stage 3a chronic kidney  disease ([place]) - Will avoid nephrotoxic agents. -     Dapagliflozin Pro-metFORMIN ER (XIGDUO XR) 12-998 MG TB24; Take 1 tablet by mouth daily.  Other orders -     LDL cholesterol, direct   I am having Espen B. Cossey maintain his diltiazem, losartan, Metoprolol Succinate, aspirin EC, BD Pen Needle Micro U/F, spironolactone, cetirizine, triamcinolone, guaiFENesin, FreeStyle Libre 2 Sensor, meclizine, Toujeo Max SoloStar, [employer] 2 Reader, atorvastatin, and Dapagliflozin Pro-metFORMIN ER.  Meds ordered this encounter  Medications   Dapagliflozin Pro-metFORMIN ER (XIGDUO XR) 12-998 MG TB24    Sig: Take 1 tablet by mouth daily.    Dispense:  90 tablet    Refill:  0     Follow-up: Return in about 3 months (around 08/04/2022).  Scarlette Calico, MD

## 2022-05-07 ENCOUNTER — Encounter: Payer: Self-pay | Admitting: Internal Medicine

## 2022-05-17 ENCOUNTER — Other Ambulatory Visit: Payer: Self-pay | Admitting: Internal Medicine

## 2022-05-17 DIAGNOSIS — E139 Other specified diabetes mellitus without complications: Secondary | ICD-10-CM

## 2022-05-17 DIAGNOSIS — N1831 Chronic kidney disease, stage 3a: Secondary | ICD-10-CM

## 2022-05-21 ENCOUNTER — Ambulatory Visit: Payer: Medicare Other | Admitting: Internal Medicine

## 2022-05-31 ENCOUNTER — Ambulatory Visit
Admission: RE | Admit: 2022-05-31 | Discharge: 2022-05-31 | Disposition: A | Discharge status: Home / Self Care | Payer: Medicare Other | Source: Ambulatory Visit | Attending: Internal Medicine | Admitting: Internal Medicine

## 2022-05-31 ENCOUNTER — Ambulatory Visit: Payer: Medicare Other

## 2022-05-31 ENCOUNTER — Other Ambulatory Visit: Payer: Medicare Other

## 2022-05-31 DIAGNOSIS — N6315 Unspecified lump in the right breast, overlapping quadrants: Secondary | ICD-10-CM

## 2022-05-31 DIAGNOSIS — N62 Hypertrophy of breast: Secondary | ICD-10-CM | POA: Diagnosis not present

## 2022-06-01 ENCOUNTER — Encounter: Payer: Self-pay | Admitting: Internal Medicine

## 2022-06-02 ENCOUNTER — Telehealth: Payer: Self-pay

## 2022-06-02 NOTE — Telephone Encounter (Signed)
Contacted Joseph Walker to schedule their annual wellness visit. Appointment made for 06/10/22.  Norton Blizzard, [place] ([facility])  [facility] [phone]

## 2022-06-10 ENCOUNTER — Ambulatory Visit (INDEPENDENT_AMBULATORY_CARE_PROVIDER_SITE_OTHER): Payer: Medicare Other

## 2022-06-10 VITALS — Ht 69.0 in | Wt 179.0 lb

## 2022-06-10 DIAGNOSIS — Z0000 Encounter for general adult medical examination without abnormal findings: Secondary | ICD-10-CM | POA: Diagnosis not present

## 2022-06-10 DIAGNOSIS — Z135 Encounter for screening for eye and ear disorders: Secondary | ICD-10-CM | POA: Diagnosis not present

## 2022-06-10 NOTE — Progress Notes (Signed)
I connected with  [name] on 06/10/22 by a audio enabled telemedicine application and verified that I am speaking with the correct person using two identifiers.  Patient Location: Home  Provider Location: Office/Clinic  I discussed the limitations of evaluation and management by telemedicine. The patient expressed understanding and agreed to proceed.  Subjective:   Joseph Walker is a 72 y.o. male who presents for Medicare Annual/Subsequent preventive examination.  Review of Systems     Cardiac Risk Factors include: advanced age (>26men, >20 women);diabetes mellitus;dyslipidemia;hypertension;male gender;sedentary lifestyle     Objective:    Today's Vitals   06/10/22 1257  Weight: 179 lb (81.2 kg)  Height: 5\' 9"  (1.753 m)   Body mass index is 26.43 kg/m.     06/10/2022    1:19 PM 11/11/2021    8:51 AM  Advanced Directives  Does Patient Have a Medical Advance Directive? No No  Would patient like information on creating a medical advance directive?  No - Patient declined    Current Medications (verified) Outpatient Encounter Medications as of 06/10/2022  Medication Sig   aspirin EC 81 MG tablet Take 1 tablet (81 mg total) by mouth daily.   atorvastatin (LIPITOR) 40 MG tablet Take 1 tablet (40 mg total) by mouth daily.   cetirizine (ZYRTEC) 10 MG tablet Take 1 tablet (10 mg total) by mouth daily.   Continuous Blood Gluc Receiver (FREESTYLE LIBRE 2 READER) DEVI 1 Act by Does not apply route daily.   Continuous Blood Gluc Sensor (FREESTYLE LIBRE 2 SENSOR) MISC USE DAILY   Dapagliflozin Pro-metFORMIN ER 12-998 MG TB24 TAKE 1 TABLET BY MOUTH DAILY   diltiazem (CARDIZEM CD) 240 MG 24 hr capsule Take 240 mg by mouth daily.   insulin glargine, 2 Unit Dial, (TOUJEO MAX SOLOSTAR) 300 UNIT/ML Solostar Pen Inject 20 Units into the skin daily.   Insulin Pen Needle (BD PEN NEEDLE MICRO U/F) 32G X 6 MM MISC USE DAILY   losartan (COZAAR) 100 MG tablet Take 100 mg by mouth daily.    meclizine (ANTIVERT) 25 MG tablet Take 0.5 tablets (12.5 mg total) by mouth 3 (three) times daily as needed for dizziness.   Metoprolol Succinate 25 MG CS24 Take by mouth.   spironolactone (ALDACTONE) 25 MG tablet Take 25 mg by mouth daily.   triamcinolone (NASACORT) 55 MCG/ACT AERO nasal inhaler Place 2 sprays into the nose daily.   guaiFENesin (MUCINEX) 600 MG 12 hr tablet Take 2 tablets (1,200 mg total) by mouth 2 (two) times daily as needed. (Patient not taking: Reported on 06/10/2022)   No facility-administered encounter medications on file as of 06/10/2022.    Allergies (verified) Patient has no known allergies.   History: Past Medical History:  Diagnosis Date   Diabetes mellitus without complication (HCC)    Glaucoma    Hyperlipemia    Hypertension    Tachycardia    Past Surgical History:  Procedure Laterality Date   TUMOR REMOVAL Left    Left Breast   Family History  Problem Relation Age of Onset   Glaucoma Mother    Cataracts Mother    Alzheimer's disease Mother    Lung disease Father    Alzheimer's disease Maternal Grandmother    Alzheimer's disease Maternal Grandfather    Social History   Socioeconomic History   Marital status: Single    Spouse name: Not on file   Number of children: Not on file   Years of education: Not on file   Highest  education level: Not on file  Occupational History   Not on file  Tobacco Use   Smoking status: Never    Passive exposure: Never   Smokeless tobacco: Never  Vaping Use   Vaping Use: Never used  Substance and Sexual Activity   Alcohol use: Yes    Alcohol/week: 2.0 standard drinks of alcohol    Types: 2 Cans of beer per week   Drug use: Never   Sexual activity: Yes    Partners: Female  Other Topics Concern   Not on file  Social History Narrative   Not on file   Social Determinants of Health   Financial Resource Strain: High Risk (06/10/2022)   Overall Financial Resource Strain (CARDIA)    Difficulty of Paying  Living Expenses: Very hard  Food Insecurity: Food Insecurity Present (06/10/2022)   Hunger Vital Sign    Worried About Running Out of Food in the Last Year: Sometimes true    Ran Out of Food in the Last Year: Sometimes true  Transportation Needs: No Transportation Needs (06/10/2022)   PRAPARE - Hydrologist (Medical): No    Lack of Transportation (Non-Medical): No  Physical Activity: Inactive (06/10/2022)   Exercise Vital Sign    Days of Exercise per Week: 0 days    Minutes of Exercise per Session: 0 min  Stress: No Stress Concern Present (06/10/2022)   [place]    Feeling of Stress : Only a little  Social Connections: Socially Isolated (06/10/2022)   Social Connection and Isolation Panel [NHANES]    Frequency of Communication with Friends and Family: More than three times a week    Frequency of Social Gatherings with Friends and Family: Once a week    Attends Religious Services: Never    Marine scientist or Organizations: No    Attends Music therapist: Never    Marital Status: Never married    Tobacco Counseling Counseling given: Not Answered   Clinical Intake:  Pre-visit preparation completed: Yes  Pain : No/denies pain     BMI - recorded: 26.43 Nutritional Status: BMI 25 -29 Overweight Nutritional Risks: None Diabetes: Yes CBG done?: No Did pt. bring in CBG monitor from home?: No  How often do you need to have someone help you when you read instructions, pamphlets, or other written materials from your doctor or pharmacy?: 1 - Never  Diabetic?yes  Interpreter Needed?: No  Comments: lives alone Information entered by :: B.Shanora Christensen,LPN   Activities of Daily Living    06/10/2022    1:20 PM  In your present state of health, do you have any difficulty performing the following activities:  Hearing? 0  Vision? 1  Difficulty concentrating or making  decisions? 0  Walking or climbing stairs? 0  Dressing or bathing? 0  Doing errands, shopping? 0  Preparing Food and eating ? N  Using the Toilet? N  In the past six months, have you accidently leaked urine? N  Do you have problems with loss of bowel control? N  Managing your Medications? N  Managing your Finances? N  Housekeeping or managing your Housekeeping? N    Patient Care Team: Janith Lima, MD as PCP - General (Internal Medicine)  Indicate any recent Medical Services you may have received from other than [facility] providers in the past year (date may be approximate).     Assessment:   This is a routine wellness examination  for [place].  Hearing/Vision screen Hearing Screening - Comments:: Adequate hearing Vision Screening - Comments:: In adequate vision..has to have readers Needs referral for eye md  Dietary issues and exercise activities discussed: Current Exercise Habits: The patient does not participate in regular exercise at present, Exercise limited by: None identified;orthopedic condition(s)   Goals Addressed   None    Depression Screen    06/10/2022    1:11 PM 02/27/2022   10:30 AM 02/12/2022    1:05 PM 11/26/2021    2:42 PM 08/28/2021   10:10 AM 06/20/2021    2:51 PM 07/04/2020    4:07 PM  PHQ 2/9 Scores  PHQ - 2 Score 0 0   6 3 3   PHQ- 9 Score  0   20 10 5   Exception Documentation   Patient refusal Patient refusal       Fall Risk    06/10/2022    1:06 PM 11/26/2021    2:43 PM 08/28/2021   10:05 AM 07/04/2020    2:35 PM  [place] in the past year? 0 0 0 0  Number falls in past yr: 0   0  Injury with Fall? 0 0  0  Risk for fall due to : No Fall Risks No Fall Risks    Follow up Education provided;Falls prevention discussed Falls evaluation completed      FALL RISK PREVENTION PERTAINING TO THE HOME:  Any stairs in or around the home? Yes  If so, are there any without handrails? Yes  Home free of loose throw rugs in walkways, pet beds,  electrical cords, etc? Yes  Adequate lighting in your home to reduce risk of falls? Yes   ASSISTIVE DEVICES UTILIZED TO PREVENT FALLS:  Life alert? No  Use of a cane, walker or w/c? No  Grab bars in the bathroom? Yes  Shower chair or bench in shower? No  Elevated toilet seat or a handicapped toilet? No     Cognitive Function:        06/10/2022    1:23 PM  6CIT Screen  What Year? 0 points  What month? 0 points  What time? 0 points  Count back from 20 0 points  Months in reverse 4 points  Repeat phrase 4 points  Total Score 8 points    Immunizations Immunization History  Administered Date(s) Administered   PFIZER(Purple Top)SARS-COV-2 Vaccination 05/30/2019, 07/05/2019    TDAP status: Due, Education has been provided regarding the importance of this vaccine. Advised may receive this vaccine at local pharmacy or Health Dept. Aware to provide a copy of the vaccination record if obtained from local pharmacy or Health Dept. Verbalized acceptance and understanding.  Flu Vaccine status: Declined, Education has been provided regarding the importance of this vaccine but patient still declined. Advised may receive this vaccine at local pharmacy or Health Dept. Aware to provide a copy of the vaccination record if obtained from local pharmacy or Health Dept. Verbalized acceptance and understanding.  Pneumococcal vaccine status: Declined,  Education has been provided regarding the importance of this vaccine but patient still declined. Advised may receive this vaccine at local pharmacy or Health Dept. Aware to provide a copy of the vaccination record if obtained from local pharmacy or Health Dept. Verbalized acceptance and understanding.   Covid-19 vaccine status: Completed vaccines  Qualifies for Shingles Vaccine? Yes   Zostavax completed No   Shingrix Completed?: No.    Education has been provided regarding the importance of this  vaccine. Patient has been advised to call insurance  company to determine out of pocket expense if they have not yet received this vaccine. Advised may also receive vaccine at local pharmacy or Health Dept. Verbalized acceptance and understanding.  Screening Tests Health Maintenance  Topic Date Due   OPHTHALMOLOGY EXAM  Never done   DTaP/Tdap/Td (1 - Tdap) Never done   COLONOSCOPY (Pts 45-50yrs Insurance coverage will need to be confirmed)  Never done   Zoster Vaccines- Shingrix (1 of 2) Never done   Pneumonia Vaccine 42+ Years old (1 of 1 - PCV) Never done   INFLUENZA VACCINE  Never done   COVID-19 Vaccine (3 - 2023-24 season) 11/22/2021   FOOT EXAM  06/21/2022   HEMOGLOBIN A1C  08/29/2022   Diabetic kidney evaluation - eGFR measurement  05/07/2023   Diabetic kidney evaluation - Urine ACR  05/07/2023   Medicare Annual Wellness (AWV)  06/10/2023   Hepatitis C Screening  Completed   HPV VACCINES  Aged Out    Health Maintenance  Health Maintenance Due  Topic Date Due   OPHTHALMOLOGY EXAM  Never done   DTaP/Tdap/Td (1 - Tdap) Never done   COLONOSCOPY (Pts 45-51yrs Insurance coverage will need to be confirmed)  Never done   Zoster Vaccines- Shingrix (1 of 2) Never done   Pneumonia Vaccine 18+ Years old (1 of 1 - PCV) Never done   INFLUENZA VACCINE  Never done   COVID-19 Vaccine (3 - 2023-24 season) 11/22/2021    Colorectal cancer screening: Type of screening: Cologuard. Completed no. Repeat every 3 years will do   Lung Cancer Screening: (Low Dose CT Chest recommended if Age 27-80 years, 30 pack-year currently smoking OR have quit w/in 15years.) does not qualify.   Lung Cancer Screening Referral: no  Additional Screening:  Hepatitis C Screening: does not qualify; Completed yes  Vision Screening: Recommended annual ophthalmology exams for early detection of glaucoma and other disorders of the eye. Is the patient up to date with their annual eye exam?  Yes  Who is the provider or what is the name of the office in which the  patient attends annual eye exams? Needs eye MD If pt is not established with a provider, would they like to be referred to a provider to establish care? Yes .   Dental Screening: Recommended annual dental exams for proper oral hygiene  Community Resource Referral / Chronic Care Management: CRR required this visit?  No   CCM required this visit?  Yes      Plan:     I have personally reviewed and noted the following in the patient's chart:   Medical and social history Use of alcohol, tobacco or illicit drugs  Current medications and supplements including opioid prescriptions. Patient is not currently taking opioid prescriptions. Functional ability and status Nutritional status Physical activity Advanced directives List of other physicians Hospitalizations, surgeries, and ER visits in previous 12 months Vitals Screenings to include cognitive, depression, and falls Referrals and appointments  In addition, I have reviewed and discussed with patient certain preventive protocols, quality metrics, and best practice recommendations. A written personalized care plan for preventive services as well as general preventive health recommendations were provided to patient.     Roger Shelter, LPN   [id number]   Nurse Notes: pt has NUMEROUS concerns regarding his health as well as ability to afford basic living costs (esp heating). *Pt sts the cologard kit sent over a year ago and he has not completed.  Pt says he will do if sent again. *Pt says he received a letter regarding his kidney function being decreased. He relays he does not understand what is needed or what he should be doing. *Pt has a concern about the Toujero as he relays breast swelling occurred after use of this medication. He desires a medication/injection that will help control BS and lose weight. *Pt having problems with his eyes and needs a referral to Ophthalmology/[facility] (as diabetic): order placed

## 2022-06-10 NOTE — Patient Instructions (Signed)
Joseph Walker , Thank you for taking time to come for your Medicare Wellness Visit. I appreciate your ongoing commitment to your health goals. Please review the following plan we discussed and let me know if I can assist you in the future.   These are the goals we discussed:  Goals   None     This is a list of the screening recommended for you and due dates:  Health Maintenance  Topic Date Due   Eye exam for diabetics  Never done   DTaP/Tdap/Td vaccine (1 - Tdap) Never done   Colon Cancer Screening  Never done   Zoster (Shingles) Vaccine (1 of 2) Never done   Pneumonia Vaccine (1 of 1 - PCV) Never done   Flu Shot  Never done   COVID-19 Vaccine (3 - 2023-24 season) 11/22/2021   Complete foot exam   06/21/2022   Hemoglobin A1C  08/29/2022   Yearly kidney function blood test for diabetes  05/07/2023   Yearly kidney health urinalysis for diabetes  05/07/2023   Medicare Annual Wellness Visit  06/10/2023   Hepatitis C Screening: USPSTF Recommendation to screen - Ages 18-79 yo.  Completed   HPV Vaccine  Aged Out    Advanced directives: no  Conditions/risks identified: none  Next appointment: Follow up in one year for your annual wellness visit. 06/11/2023 @1pm  telephone  Preventive Care 17 Years and Older, Male  Preventive care refers to lifestyle choices and visits with your health care provider that can promote health and wellness. What does preventive care include? A yearly physical exam. This is also called an annual well check. Dental exams once or twice a year. Routine eye exams. Ask your health care provider how often you should have your eyes checked. Personal lifestyle choices, including: Daily care of your teeth and gums. Regular physical activity. Eating a healthy diet. Avoiding tobacco and drug use. Limiting alcohol use. Practicing safe sex. Taking low doses of aspirin every day. Taking vitamin and mineral supplements as recommended by your health care provider. What  happens during an annual well check? The services and screenings done by your health care provider during your annual well check will depend on your age, overall health, lifestyle risk factors, and family history of disease. Counseling  Your health care provider may ask you questions about your: Alcohol use. Tobacco use. Drug use. Emotional well-being. Home and relationship well-being. Sexual activity. Eating habits. History of falls. Memory and ability to understand (cognition). Work and work Statistician. Screening  You may have the following tests or measurements: Height, weight, and BMI. Blood pressure. Lipid and cholesterol levels. These may be checked every 5 years, or more frequently if you are over 4 years old. Skin check. Lung cancer screening. You may have this screening every year starting at age 47 if you have a 30-pack-year history of smoking and currently smoke or have quit within the past 15 years. Fecal occult blood test (FOBT) of the stool. You may have this test every year starting at age 25. Flexible sigmoidoscopy or colonoscopy. You may have a sigmoidoscopy every 5 years or a colonoscopy every 10 years starting at age 24. Prostate cancer screening. Recommendations will vary depending on your family history and other risks. Hepatitis C blood test. Hepatitis B blood test. Sexually transmitted disease (STD) testing. Diabetes screening. This is done by checking your blood sugar (glucose) after you have not eaten for a while (fasting). You may have this done every 1-3 years. Abdominal aortic aneurysm (  AAA) screening. You may need this if you are a current or former smoker. Osteoporosis. You may be screened starting at age 34 if you are at high risk. Talk with your health care provider about your test results, treatment options, and if necessary, the need for more tests. Vaccines  Your health care provider may recommend certain vaccines, such as: Influenza vaccine. This  is recommended every year. Tetanus, diphtheria, and acellular pertussis (Tdap, Td) vaccine. You may need a Td booster every 10 years. Zoster vaccine. You may need this after age 30. Pneumococcal 13-valent conjugate (PCV13) vaccine. One dose is recommended after age 32. Pneumococcal polysaccharide (PPSV23) vaccine. One dose is recommended after age 90. Talk to your health care provider about which screenings and vaccines you need and how often you need them. This information is not intended to replace advice given to you by your health care provider. Make sure you discuss any questions you have with your health care provider. Document Released: 04/06/2015 Document Revised: 11/28/2015 Document Reviewed: 01/09/2015 Elsevier Interactive Patient Education  2017 [place] Prevention in the Home Falls can cause injuries. They can happen to people of all ages. There are many things you can do to make your home safe and to help prevent falls. What can I do on the outside of my home? Regularly fix the edges of walkways and driveways and fix any cracks. Remove anything that might make you trip as you walk through a door, such as a raised step or threshold. Trim any bushes or trees on the path to your home. Use bright outdoor lighting. Clear any walking paths of anything that might make someone trip, such as rocks or tools. Regularly check to see if handrails are loose or broken. Make sure that both sides of any steps have handrails. Any raised decks and porches should have guardrails on the edges. Have any leaves, snow, or ice cleared regularly. Use sand or salt on walking paths during winter. Clean up any spills in your garage right away. This includes oil or grease spills. What can I do in the bathroom? Use night lights. Install grab bars by the toilet and in the tub and shower. Do not use towel bars as grab bars. Use non-skid mats or decals in the tub or shower. If you need to sit down  in the shower, use a plastic, non-slip stool. Keep the floor dry. Clean up any water that spills on the floor as soon as it happens. Remove soap buildup in the tub or shower regularly. Attach bath mats securely with double-sided non-slip rug tape. Do not have throw rugs and other things on the floor that can make you trip. What can I do in the bedroom? Use night lights. Make sure that you have a light by your bed that is easy to reach. Do not use any sheets or blankets that are too big for your bed. They should not hang down onto the floor. Have a firm chair that has side arms. You can use this for support while you get dressed. Do not have throw rugs and other things on the floor that can make you trip. What can I do in the kitchen? Clean up any spills right away. Avoid walking on wet floors. Keep items that you use a lot in easy-to-reach places. If you need to reach something above you, use a strong step stool that has a grab bar. Keep electrical cords out of the way. Do not use floor polish  or wax that makes floors slippery. If you must use wax, use non-skid floor wax. Do not have throw rugs and other things on the floor that can make you trip. What can I do with my stairs? Do not leave any items on the stairs. Make sure that there are handrails on both sides of the stairs and use them. Fix handrails that are broken or loose. Make sure that handrails are as long as the stairways. Check any carpeting to make sure that it is firmly attached to the stairs. Fix any carpet that is loose or worn. Avoid having throw rugs at the top or bottom of the stairs. If you do have throw rugs, attach them to the floor with carpet tape. Make sure that you have a light switch at the top of the stairs and the bottom of the stairs. If you do not have them, ask someone to add them for you. What else can I do to help prevent falls? Wear shoes that: Do not have high heels. Have rubber bottoms. Are comfortable  and fit you well. Are closed at the toe. Do not wear sandals. If you use a stepladder: Make sure that it is fully opened. Do not climb a closed stepladder. Make sure that both sides of the stepladder are locked into place. Ask someone to hold it for you, if possible. Clearly mark and make sure that you can see: Any grab bars or handrails. First and last steps. Where the edge of each step is. Use tools that help you move around (mobility aids) if they are needed. These include: Canes. Walkers. Scooters. Crutches. Turn on the lights when you go into a dark area. Replace any light bulbs as soon as they burn out. Set up your furniture so you have a clear path. Avoid moving your furniture around. If any of your floors are uneven, fix them. If there are any pets around you, be aware of where they are. Review your medicines with your doctor. Some medicines can make you feel dizzy. This can increase your chance of falling. Ask your doctor what other things that you can do to help prevent falls. This information is not intended to replace advice given to you by your health care provider. Make sure you discuss any questions you have with your health care provider. Document Released: 01/04/2009 Document Revised: 08/16/2015 Document Reviewed: 04/14/2014 Elsevier Interactive Patient Education  2017 [employer].

## 2022-07-11 ENCOUNTER — Telehealth: Payer: Self-pay | Admitting: Internal Medicine

## 2022-07-11 ENCOUNTER — Other Ambulatory Visit: Payer: Self-pay | Admitting: Internal Medicine

## 2022-07-11 DIAGNOSIS — E139 Other specified diabetes mellitus without complications: Secondary | ICD-10-CM

## 2022-07-11 MED ORDER — BD PEN NEEDLE MICRO U/F 32G X 6 MM MISC: 1 refills | Status: AC

## 2022-07-11 NOTE — Telephone Encounter (Signed)
Patient needs pen tip ultra fine needles called in for his in for his insulin  Pharmacy:  [facility] on [address] [place]

## 2022-07-16 ENCOUNTER — Encounter: Payer: Self-pay | Admitting: Internal Medicine

## 2022-07-16 ENCOUNTER — Ambulatory Visit (INDEPENDENT_AMBULATORY_CARE_PROVIDER_SITE_OTHER): Payer: Medicare Other | Admitting: Internal Medicine

## 2022-07-16 VITALS — BP 128/82 | HR 77 | Temp 97.9°F | Ht 69.0 in | Wt 179.0 lb

## 2022-07-16 DIAGNOSIS — N400 Enlarged prostate without lower urinary tract symptoms: Secondary | ICD-10-CM | POA: Insufficient documentation

## 2022-07-16 DIAGNOSIS — N1831 Chronic kidney disease, stage 3a: Secondary | ICD-10-CM | POA: Diagnosis not present

## 2022-07-16 DIAGNOSIS — I10 Essential (primary) hypertension: Secondary | ICD-10-CM

## 2022-07-16 DIAGNOSIS — E139 Other specified diabetes mellitus without complications: Secondary | ICD-10-CM

## 2022-07-16 DIAGNOSIS — Z1211 Encounter for screening for malignant neoplasm of colon: Secondary | ICD-10-CM

## 2022-07-16 LAB — URINALYSIS, ROUTINE W REFLEX MICROSCOPIC
Bilirubin Urine: NEGATIVE
Hgb urine dipstick: NEGATIVE
Ketones, ur: NEGATIVE
Leukocytes,Ua: NEGATIVE
Nitrite: NEGATIVE
RBC / HPF: NONE SEEN (ref 0–?)
Specific Gravity, Urine: 1.015 (ref 1.000–1.030)
Total Protein, Urine: NEGATIVE
Urine Glucose: >=1000 — AB
Urobilinogen, UA: 1 (ref 0.0–1.0)
pH: 6 (ref 5.0–8.0)

## 2022-07-16 LAB — BASIC METABOLIC PANEL
BUN: 21 mg/dL (ref 6–23)
CO2: 26 mEq/L (ref 19–32)
Calcium: 9.7 mg/dL (ref 8.4–10.5)
Chloride: 100 mEq/L (ref 96–112)
Creatinine, Ser: 1.3 mg/dL (ref 0.40–1.50)
GFR: 55.63 mL/min — ABNORMAL LOW (ref >60.00)
Glucose, Bld: 138 mg/dL — ABNORMAL HIGH (ref 70–99)
Potassium: 4 mEq/L (ref 3.5–5.1)
Sodium: 137 mEq/L (ref 135–145)

## 2022-07-16 LAB — HEMOGLOBIN A1C: Hgb A1c MFr Bld: 7.9 % — ABNORMAL HIGH (ref 4.6–6.5)

## 2022-07-16 LAB — PSA: PSA: 1.68 ng/mL (ref 0.10–4.00)

## 2022-07-16 NOTE — Patient Instructions (Signed)

## 2022-07-16 NOTE — Progress Notes (Signed)
Subjective:  Patient ID: Joseph Walker, male    DOB: 09-23-1950  Age: 72 y.o. MRN: [id number]  CC: Hypertension and Diabetes   HPI [name] presents for f/up ---  He uses a push mower and has good endurance.  He denies chest pain, shortness of breath, diaphoresis, or edema.  His lowest recorded blood sugar has been 60.    Outpatient Medications Prior to Visit  Medication Sig Dispense Refill   aspirin EC 81 MG tablet Take 1 tablet (81 mg total) by mouth daily. 90 tablet 1   atorvastatin (LIPITOR) 40 MG tablet Take 1 tablet (40 mg total) by mouth daily. 90 tablet 1   cetirizine (ZYRTEC) 10 MG tablet Take 1 tablet (10 mg total) by mouth daily. 30 tablet 11   Continuous Blood Gluc Receiver (FREESTYLE LIBRE 2 READER) DEVI 1 Act by Does not apply route daily. 2 each 5   Continuous Blood Gluc Sensor (FREESTYLE LIBRE 2 SENSOR) MISC USE DAILY 2 each 5   Dapagliflozin Pro-metFORMIN ER 12-998 MG TB24 TAKE 1 TABLET BY MOUTH DAILY 90 tablet 0   diltiazem (CARDIZEM CD) 240 MG 24 hr capsule Take 240 mg by mouth daily.     guaiFENesin (MUCINEX) 600 MG 12 hr tablet Take 2 tablets (1,200 mg total) by mouth 2 (two) times daily as needed. 60 tablet 1   insulin glargine, 2 Unit Dial, (TOUJEO MAX SOLOSTAR) 300 UNIT/ML Solostar Pen Inject 20 Units into the skin daily. 6 mL 1   Insulin Pen Needle (BD PEN NEEDLE MICRO U/F) 32G X 6 MM MISC USE DAILY 100 each 1   losartan (COZAAR) 100 MG tablet Take 100 mg by mouth daily.     meclizine (ANTIVERT) 25 MG tablet Take 0.5 tablets (12.5 mg total) by mouth 3 (three) times daily as needed for dizziness. 60 tablet 1   Metoprolol Succinate 25 MG CS24 Take by mouth.     spironolactone (ALDACTONE) 25 MG tablet Take 25 mg by mouth daily.     triamcinolone (NASACORT) 55 MCG/ACT AERO nasal inhaler Place 2 sprays into the nose daily. 1 each 12   No facility-administered medications prior to visit.    ROS Review of Systems  Constitutional:  Negative for chills,  diaphoresis, fatigue and fever.  HENT: Negative.    Eyes: Negative.   Respiratory: Negative.  Negative for cough, chest tightness, shortness of breath and wheezing.   Cardiovascular:  Negative for chest pain, palpitations and leg swelling.  Gastrointestinal:  Negative for abdominal pain, constipation, diarrhea, nausea and vomiting.  Endocrine: Positive for polyuria. Negative for polydipsia and polyphagia.  Genitourinary: Negative.   Musculoskeletal: Negative.  Negative for arthralgias and myalgias.  Skin: Negative.  Negative for color change and pallor.  Neurological:  Negative for dizziness, weakness and light-headedness.  Hematological:  Negative for adenopathy. Does not bruise/bleed easily.  Psychiatric/Behavioral: Negative.      Objective:  BP 128/82 (BP Location: Left Arm, Patient Position: Sitting, Cuff Size: Large)   Pulse 77   Temp 97.9 F (36.6 C) (Oral)   Ht 5\' 9"  (1.753 m)   Wt 179 lb (81.2 kg)   SpO2 97%   BMI 26.43 kg/m   BP Readings from Last 3 Encounters:  07/16/22 128/82  05/06/22 126/76  02/27/22 (!) 144/88    Wt Readings from Last 3 Encounters:  07/16/22 179 lb (81.2 kg)  06/10/22 179 lb (81.2 kg)  05/06/22 179 lb (81.2 kg)    Physical Exam Vitals reviewed.  Constitutional:      Appearance: Normal appearance.  HENT:     Nose: Nose normal.     Mouth/Throat:     Mouth: Mucous membranes are moist.  Eyes:     General: No scleral icterus.    Conjunctiva/sclera: Conjunctivae normal.  Cardiovascular:     Rate and Rhythm: Normal rate and regular rhythm.     Pulses: Normal pulses.     Heart sounds: No murmur heard.    No gallop.  Pulmonary:     Effort: Pulmonary effort is normal.     Breath sounds: No stridor. No wheezing, rhonchi or rales.  Abdominal:     General: Abdomen is flat.     Palpations: There is no mass.     Tenderness: There is no abdominal tenderness. There is no guarding.     Hernia: No hernia is present.  Musculoskeletal:      Cervical back: Neck supple.  Lymphadenopathy:     Cervical: No cervical adenopathy.  Skin:    General: Skin is warm and dry.  Neurological:     General: No focal deficit present.     Mental Status: He is alert.  Psychiatric:        Mood and Affect: Mood normal.        Behavior: Behavior normal.     Lab Results  Component Value Date   WBC 6.1 05/06/2022   HGB 14.8 05/06/2022   HCT 45.0 05/06/2022   PLT 190.0 05/06/2022   GLUCOSE 138 (H) 07/16/2022   CHOL 130 05/06/2022   TRIG 222.0 (H) 05/06/2022   HDL 39.70 05/06/2022   LDLDIRECT 63.0 05/06/2022   ALT 13 05/06/2022   AST 17 05/06/2022   NA 137 07/16/2022   K 4.0 07/16/2022   CL 100 07/16/2022   CREATININE 1.30 07/16/2022   BUN 21 07/16/2022   CO2 26 07/16/2022   TSH 2.43 06/20/2021   PSA 1.68 07/16/2022   HGBA1C 7.9 (H) 07/16/2022   MICROALBUR <0.7 05/06/2022    MM DIAG BREAST TOMO BILATERAL  Result Date: 05/31/2022 CLINICAL DATA:  72 year old male with generalized right breast swelling and mild right nipple/retroareolar tenderness. Patient states he noticed this swelling car within the last 6 months to year after starting medication for diabetes. Patient states this has happened on the right side many years ago however had resolved on its own. Patient has a history of left breast/nipple excision for a benign "knot" near the nipple. EXAM: DIGITAL DIAGNOSTIC BILATERAL MAMMOGRAM WITH TOMOSYNTHESIS TECHNIQUE: Bilateral digital diagnostic mammography and breast tomosynthesis was performed. COMPARISON:  None. ACR Breast Density Category c: The breasts are heterogeneously dense, which may obscure small masses. FINDINGS: No suspicious masses or calcifications are seen in either breast. Postsurgical changes from prior benign excision are present in the left breast. There is diffuse density throughout the right breast most compatible with gynecomastia. This is felt to account for the patient's diffuse/nonfocal swelling and tenderness.  Physical examination reveals generalized enlargement of the right breast which is soft to palpation with no underlying palpable masses. All 4 quadrants were palpated. IMPRESSION: Gynecomastia accounting for patient's diffuse swelling and tenderness in the right breast. No findings of malignancy in either breast. RECOMMENDATION: Recommend clinical follow-up. I have discussed the findings and recommendations with the patient. If applicable, a reminder letter will be sent to the patient regarding the next appointment. BI-RADS CATEGORY  2: Benign. Electronically Signed   By: Edwin Cap M.D.   On: 05/31/2022 12:00   Assessment &  Plan:   Diabetes 1.5, managed as type 1 (HCC)- His A1c has improved down to 7.9%. -     Hemoglobin A1c; Future -     Basic metabolic panel; Future -     HM Diabetes Foot Exam  Primary hypertension- His blood pressure is adequately well-controlled. -     Basic metabolic panel; Future -     Urinalysis, Routine w reflex microscopic; Future  Stage 3a chronic kidney disease (HCC)- His renal function is stable. -     Basic metabolic panel; Future -     Urinalysis, Routine w reflex microscopic; Future  Screening for colon cancer -     Ambulatory referral to Gastroenterology  Benign prostatic hyperplasia without lower urinary tract symptoms -     PSA; Future     Follow-up: Return in about 4 months (around 11/15/2022).  Sanda Linger, MD

## 2022-07-18 ENCOUNTER — Encounter: Payer: Self-pay | Admitting: Internal Medicine

## 2022-08-21 ENCOUNTER — Other Ambulatory Visit: Payer: Self-pay | Admitting: Internal Medicine

## 2022-08-21 DIAGNOSIS — E139 Other specified diabetes mellitus without complications: Secondary | ICD-10-CM

## 2022-08-21 DIAGNOSIS — N1831 Chronic kidney disease, stage 3a: Secondary | ICD-10-CM

## 2022-09-03 ENCOUNTER — Telehealth: Payer: Self-pay | Admitting: Internal Medicine

## 2022-09-03 DIAGNOSIS — E139 Other specified diabetes mellitus without complications: Secondary | ICD-10-CM

## 2022-09-03 MED ORDER — FREESTYLE LIBRE 2 SENSOR MISC
5 refills | Status: DC
Start: 2022-09-03 — End: 2023-04-21

## 2022-09-03 NOTE — Telephone Encounter (Signed)
Patient needs a free style libre sensor.  Please send to [facility] on [address].

## 2022-10-06 ENCOUNTER — Ambulatory Visit (INDEPENDENT_AMBULATORY_CARE_PROVIDER_SITE_OTHER): Payer: Medicare Other | Admitting: Internal Medicine

## 2022-10-06 ENCOUNTER — Encounter: Payer: Self-pay | Admitting: Internal Medicine

## 2022-10-06 VITALS — BP 126/78 | HR 82 | Temp 98.5°F | Resp 16 | Ht 69.0 in | Wt 172.0 lb

## 2022-10-06 DIAGNOSIS — I10 Essential (primary) hypertension: Secondary | ICD-10-CM

## 2022-10-06 DIAGNOSIS — H8303 Labyrinthitis, bilateral: Secondary | ICD-10-CM | POA: Diagnosis not present

## 2022-10-06 DIAGNOSIS — E139 Other specified diabetes mellitus without complications: Secondary | ICD-10-CM

## 2022-10-06 DIAGNOSIS — R0989 Other specified symptoms and signs involving the circulatory and respiratory systems: Secondary | ICD-10-CM

## 2022-10-06 MED ORDER — PROMETHAZINE HCL 12.5 MG PO TABS
12.5000 mg | ORAL_TABLET | Freq: Four times a day (QID) | ORAL | 0 refills | Status: DC | PRN
Start: 2022-10-06 — End: 2023-07-21

## 2022-10-06 NOTE — Progress Notes (Unsigned)
Subjective:  Patient ID: Joseph Walker, male    DOB: 06/27/1950  Age: 72 y.o. MRN: [id number]  CC: No chief complaint on file.   HPI ODA LANSDOWNE presents for ***  Outpatient Medications Prior to Visit  Medication Sig Dispense Refill   aspirin EC 81 MG tablet Take 1 tablet (81 mg total) by mouth daily. 90 tablet 1   atorvastatin (LIPITOR) 40 MG tablet Take 1 tablet (40 mg total) by mouth daily. 90 tablet 1   cetirizine (ZYRTEC) 10 MG tablet Take 1 tablet (10 mg total) by mouth daily. 30 tablet 11   Continuous Blood Gluc Receiver (FREESTYLE LIBRE 2 READER) DEVI 1 Act by Does not apply route daily. 2 each 5   Continuous Glucose Sensor (FREESTYLE LIBRE 2 SENSOR) MISC Use every 14 days. 2 each 5   diltiazem (CARDIZEM CD) 240 MG 24 hr capsule Take 240 mg by mouth daily.     guaiFENesin (MUCINEX) 600 MG 12 hr tablet Take 2 tablets (1,200 mg total) by mouth 2 (two) times daily as needed. 60 tablet 1   insulin glargine, 2 Unit Dial, (TOUJEO MAX SOLOSTAR) 300 UNIT/ML Solostar Pen Inject 20 Units into the skin daily. 6 mL 1   Insulin Pen Needle (BD PEN NEEDLE MICRO U/F) 32G X 6 MM MISC USE DAILY 100 each 1   losartan (COZAAR) 100 MG tablet Take 100 mg by mouth daily.     meclizine (ANTIVERT) 25 MG tablet Take 0.5 tablets (12.5 mg total) by mouth 3 (three) times daily as needed for dizziness. 60 tablet 1   Metoprolol Succinate 25 MG CS24 Take by mouth.     triamcinolone (NASACORT) 55 MCG/ACT AERO nasal inhaler Place 2 sprays into the nose daily. 1 each 12   XIGDUO XR 12-998 MG TB24 TAKE 1 TABLET BY MOUTH DAILY 90 tablet 0   spironolactone (ALDACTONE) 25 MG tablet Take 25 mg by mouth daily. (Patient not taking: Reported on 10/06/2022)     No facility-administered medications prior to visit.    ROS Review of Systems  Objective:  BP 126/78 (BP Location: Right Arm, Patient Position: Sitting, Cuff Size: Normal)   Pulse 82   Temp 98.5 F (36.9 C) (Oral)   Resp 16   Ht 5\' 9"  (1.753 m)   Wt  172 lb (78 kg)   SpO2 97%   BMI 25.40 kg/m   BP Readings from Last 3 Encounters:  10/06/22 126/78  07/16/22 128/82  05/06/22 126/76    Wt Readings from Last 3 Encounters:  10/06/22 172 lb (78 kg)  07/16/22 179 lb (81.2 kg)  06/10/22 179 lb (81.2 kg)    Physical Exam Abdominal:     General: Abdomen is flat. Bowel sounds are normal. There is abdominal bruit. There is no distension.     Palpations: Abdomen is soft. There is no hepatomegaly, splenomegaly or mass.     Tenderness: There is no abdominal tenderness.     Lab Results  Component Value Date   WBC 6.1 05/06/2022   HGB 14.8 05/06/2022   HCT 45.0 05/06/2022   PLT 190.0 05/06/2022   GLUCOSE 138 (H) 07/16/2022   CHOL 130 05/06/2022   TRIG 222.0 (H) 05/06/2022   HDL 39.70 05/06/2022   LDLDIRECT 63.0 05/06/2022   ALT 13 05/06/2022   AST 17 05/06/2022   NA 137 07/16/2022   K 4.0 07/16/2022   CL 100 07/16/2022   CREATININE 1.30 07/16/2022   BUN 21 07/16/2022   CO2  26 07/16/2022   TSH 2.43 06/20/2021   PSA 1.68 07/16/2022   HGBA1C 7.9 (H) 07/16/2022   MICROALBUR <0.7 05/06/2022    MM DIAG BREAST TOMO BILATERAL  Result Date: 05/31/2022 CLINICAL DATA:  72 year old male with generalized right breast swelling and mild right nipple/retroareolar tenderness. Patient states he noticed this swelling car within the last 6 months to year after starting medication for diabetes. Patient states this has happened on the right side many years ago however had resolved on its own. Patient has a history of left breast/nipple excision for a benign "knot" near the nipple. EXAM: DIGITAL DIAGNOSTIC BILATERAL MAMMOGRAM WITH TOMOSYNTHESIS TECHNIQUE: Bilateral digital diagnostic mammography and breast tomosynthesis was performed. COMPARISON:  None. ACR Breast Density Category c: The breasts are heterogeneously dense, which may obscure small masses. FINDINGS: No suspicious masses or calcifications are seen in either breast. Postsurgical changes  from prior benign excision are present in the left breast. There is diffuse density throughout the right breast most compatible with gynecomastia. This is felt to account for the patient's diffuse/nonfocal swelling and tenderness. Physical examination reveals generalized enlargement of the right breast which is soft to palpation with no underlying palpable masses. All 4 quadrants were palpated. IMPRESSION: Gynecomastia accounting for patient's diffuse swelling and tenderness in the right breast. No findings of malignancy in either breast. RECOMMENDATION: Recommend clinical follow-up. I have discussed the findings and recommendations with the patient. If applicable, a reminder letter will be sent to the patient regarding the next appointment. BI-RADS CATEGORY  2: Benign. Electronically Signed   By: Edwin Cap M.D.   On: 05/31/2022 12:00   Assessment & Plan:   Viral labyrinthitis of both ears -     Promethazine HCl; Take 1 tablet (12.5 mg total) by mouth every 6 (six) hours as needed for nausea or vomiting.  Dispense: 30 tablet; Refill: 0  Diabetes 1.5, managed as type 1 (HCC)  Primary hypertension -     VAS [place] AAA DUPLEX; Future  Abdominal bruit -     VAS [place] AAA DUPLEX; Future     Follow-up: Return in about 3 weeks (around 10/27/2022).  Sanda Linger, MD

## 2022-10-06 NOTE — Patient Instructions (Signed)
Labyrinthitis  Labyrinthitis is an infection of the inner ear. Your inner ear is made up of tubes and canals (labyrinth). These are filled with fluid. There are nerve cells in your inner ear that send hearing and balance signals to your brain. When germs get inside the tubes and canals, they harm the nerve cells that send signals to the brain. This condition often starts all of a sudden and goes away in a few weeks with treatment. If the infection harms parts of the tubes and canals, some symptoms may last for a long time. What are the causes? This condition can be caused by viruses, such as one that causes: Mononucleosis, also called mono. Measles or mumps. The flu. Herpes. This condition can also be caused by bacteria that spread from an infection in the brain or the middle ear. What increases the risk? You may be at greater risk for this condition if: You had a mouth, nose, throat, or ear infection not long ago. You drink a lot of alcohol. You smoke. You use certain drugs. You are feeling tired (fatigued). You have a lot of stress. You have allergies. What are the signs or symptoms? Symptoms of this condition often start all of a sudden. The symptoms may range from mild to very bad, and may include: Dizziness. Hearing loss. A feeling that you or the things around you are moving when they are not (vertigo). Ringing in your ear (tinnitus). A feeling like you may vomit (nausea). Vomiting. Trouble focusing your eyes. If you have symptoms that last a long time, they may include: Feeling tired. Confusion. Hearing loss. Ringing in your ear. Poor balance. A feeling that you or the things around you are moving when they are not if you move your head suddenly. How is this treated? Treatment depends on the cause. If your condition is caused by bacteria, you may need antibiotic medicine. If it is caused by a virus, it may get better on its own. No matter the cause, you may be treated  with: Medicines to: Stop dizziness. Stop the feeling that you may vomit. Reduce irritation and swelling. Get better faster. Fluids through an IV tube. These may be given at a hospital. You may need IV fluids if you have very bad vomiting or feelings like you may vomit. Physical therapy. A therapist can teach you exercises to help you get used to feeling dizzy. You may need this if you have dizziness that does not go away. Follow these instructions at home: Medicines Take over-the-counter and prescription medicines only as told by your doctor. If you were prescribed an antibiotic medicine, take it as told by your doctor. Do not stop taking it even if you start to feel better. Activity Rest as told by your doctor. Limit the things you do as told. Return to your normal activities when your doctor says that it is safe. Do not make any sudden movements until you no longer feel dizzy. Do exercises as told by your doctor. General instructions Avoid loud noises and bright lights. Do not drive until your doctor says that it is safe for you. Drink enough fluid to keep your pee (urine) pale yellow. Keep all follow-up visits. Contact a doctor if: Your symptoms do not get better with medicine. You do not get better after 2 weeks. You have a fever. Get help right away if: You keep feeling like you may vomit. You keep vomiting. You are very dizzy. Your hearing gets much worse all of a sudden.  Summary Labyrinthitis is an infection of the inner ear. This condition is often caused by a virus. Symptoms include dizziness, hearing loss, and ringing in the ears. Treatment depends on the cause. Do what your doctor tells you. This information is not intended to replace advice given to you by your health care provider. Make sure you discuss any questions you have with your health care provider. Document Revised: 04/18/2020 Document Reviewed: 04/18/2020 Elsevier Patient Education  2024 [employer].

## 2022-10-07 DIAGNOSIS — R0989 Other specified symptoms and signs involving the circulatory and respiratory systems: Secondary | ICD-10-CM | POA: Insufficient documentation

## 2022-10-09 ENCOUNTER — Telehealth: Payer: Self-pay | Admitting: Internal Medicine

## 2022-10-09 ENCOUNTER — Other Ambulatory Visit: Payer: Self-pay | Admitting: Internal Medicine

## 2022-10-09 DIAGNOSIS — R42 Dizziness and giddiness: Secondary | ICD-10-CM

## 2022-10-09 DIAGNOSIS — H8303 Labyrinthitis, bilateral: Secondary | ICD-10-CM

## 2022-10-09 NOTE — Telephone Encounter (Signed)
Referral ordered

## 2022-10-09 NOTE — Telephone Encounter (Signed)
Attempted to inform pt that referral was entered. However, pt does not have a VM set up to leave a message.

## 2022-10-09 NOTE — Telephone Encounter (Signed)
Patient called and stated that he would like to see a therapist about his vertigo.  Please advise.

## 2022-10-12 ENCOUNTER — Other Ambulatory Visit: Payer: Self-pay | Admitting: Internal Medicine

## 2022-10-12 DIAGNOSIS — E785 Hyperlipidemia, unspecified: Secondary | ICD-10-CM

## 2022-10-28 ENCOUNTER — Encounter: Payer: Self-pay | Admitting: Physical Therapy

## 2022-10-28 ENCOUNTER — Ambulatory Visit: Payer: Medicare Other | Attending: Internal Medicine | Admitting: Physical Therapy

## 2022-10-28 VITALS — BP 153/86 | HR 69

## 2022-10-28 DIAGNOSIS — R2689 Other abnormalities of gait and mobility: Secondary | ICD-10-CM

## 2022-10-28 DIAGNOSIS — H8303 Labyrinthitis, bilateral: Secondary | ICD-10-CM | POA: Diagnosis not present

## 2022-10-28 DIAGNOSIS — R2681 Unsteadiness on feet: Secondary | ICD-10-CM | POA: Diagnosis not present

## 2022-10-28 DIAGNOSIS — R42 Dizziness and giddiness: Secondary | ICD-10-CM

## 2022-10-28 NOTE — Therapy (Unsigned)
OUTPATIENT PHYSICAL THERAPY VESTIBULAR EVALUATION     Patient Name: Joseph Walker MRN: [id number] DOB:1950-11-14, 72 y.o., male Today's Date: 10/30/2022  END OF SESSION:    10/28/22 1316  PT Visits / Re-Eval  Visit Number 1  Number of Visits 5 (including eval)  Date for PT Re-Evaluation 12/18/22  Authorization  Authorization Type Medicare  PT Time Calculation  PT Start Time 1315  PT Stop Time 1400  PT Time Calculation (min) 45 min  PT - End of Session  Equipment Utilized During Treatment Gait belt  Activity Tolerance Patient tolerated treatment well  Behavior During Therapy WFL for tasks assessed/performed    Past Medical History:  Diagnosis Date   Diabetes mellitus without complication (HCC)    Glaucoma    Hyperlipemia    Hypertension    Tachycardia    Past Surgical History:  Procedure Laterality Date   TUMOR REMOVAL Left    Left Breast   Patient Active Problem List   Diagnosis Date Noted   Abdominal bruit 10/07/2022   Viral labyrinthitis of both ears 10/06/2022   Screening for colon cancer 07/16/2022   Benign prostatic hyperplasia without lower urinary tract symptoms 07/16/2022   Stage 3a chronic kidney disease (HCC) 05/06/2022   Allergic rhinitis 11/27/2021   Current moderate episode of major depressive disorder without prior episode (HCC) 08/28/2021   Gastroesophageal reflux disease with esophagitis without hemorrhage 07/07/2020   Hyperlipidemia LDL goal <70 07/07/2020   Colon cancer screening 07/04/2020   Primary hypertension 07/04/2020   Diabetes 1.5, managed as type 1 (HCC) 07/04/2020    PCP: Etta Grandchild, MD REFERRING PROVIDER: Etta Grandchild, MD  REFERRING DIAG: H83.03 (ICD-10-CM) - Viral labyrinthitis of both ears R42 (ICD-10-CM) - Vertigo  THERAPY DIAG:  Dizziness and giddiness  Unsteadiness on feet  Other abnormalities of gait and mobility  ONSET DATE: 10/09/2022 (referral date)  Rationale for Evaluation and Treatment:  Rehabilitation  SUBJECTIVE:   SUBJECTIVE STATEMENT: Patient reports onset of symptoms a little less than a year ago. He woke up and the room was spinning. He went to ED and provided meclizine. Patient takes meclizine as needed since then. Patient has been retired since 2019. No other PMH of vertigo. Patient reports symptoms were worse when getting up or bending over. Patient reports that the room spinning lasted for many minutes (~15-20 minutes). Patient reports that he hasn't taken meclizine today. Patient reports he now has problems off/on but not as frequently and duration of symptoms is less.   Pt accompanied by: self  PERTINENT HISTORY: diabetes, chronic renal disease stage 2  PAIN:  Are you having pain? No  PRECAUTIONS: Fall  RED FLAGS: None   WEIGHT BEARING RESTRICTIONS: No  FALLS: Has patient fallen in last 6 months? No  LIVING ENVIRONMENT: Lives with: lives alone Lives in: House/apartment Stairs: Yes: Internal: 15 steps; on right going up Has following equipment at home: Grab bars  PLOF: Independent - retired  PATIENT GOALS: "Patient reports goal is to find out if he can get a cure for dizziness."   OBJECTIVE:   DIAGNOSTIC FINDINGS: 11/11/2021 CT Head wo Contrast: WNL  COGNITION: Overall cognitive status: Within functional limits for tasks assessed   SENSATION: WFL  EDEMA:  R side breast tissue - benign  Cervical ROM:    Active A/PROM (deg) eval  Flexion WFL  Extension WFL  Right lateral flexion WFL  Left lateral flexion WFL  Right rotation WFL  Left rotation WFL  (Blank rows = not  tested  GAIT: Gait pattern: quick pace, impulsive, ambulates without AD Distance walked: 2 x 80 feet Assistive device utilized: None Level of assistance: SBA  FUNCTIONAL TESTS:  Not performed   PATIENT SURVEYS:  FOTO:   VESTIBULAR ASSESSMENT:  GENERAL OBSERVATION: ambulates without AD, is not wearing glasses, patient states he needs glasses though   SYMPTOM  BEHAVIOR: Subjective history: symptoms all started about a year ago per patient, symptoms came on suddenly, denies hearing changes at times of incident  Non-Vestibular symptoms: denies any Type of dizziness: Imbalance (Disequilibrium), Spinning/Vertigo, Unsteady with head/body turns, and "World moves" Frequency: reports he has had 2 episodes in the last 2 weeks  Duration: 5-10 minute episodes  Aggravating factors:  waking up out of the bed  Relieving factors:  meclizine  Progression of symptoms: unchanged  OCULOMOTOR EXAM:  Ocular Alignment: normal  Ocular ROM: No Limitations  Spontaneous Nystagmus: absent  Gaze-Induced Nystagmus: absent  Smooth Pursuits: intact  Saccades: intact  Convergence/Divergence: < 5 cm   VBI: negative  Test of Skew: Normal  VESTIBULAR - OCULAR REFLEX:   Slow VOR: Normal  VOR Cancellation: Normal  Head-Impulse Test: HIT Right: negative HIT Left: negative  Dynamic Visual Acuity: To be assessed   POSITIONAL TESTING:  Right Dix-Hallpike: upbeating, right nystagmus Left Dix-Hallpike: no nystagmus Right Roll Test: no nystagmus Left Roll Test: no nystagmus  MOTION SENSITIVITY:  Motion Sensitivity Quotient Intensity: 0 = none, 1 = Lightheaded, 2 = Mild, 3 = Moderate, 4 = Severe, 5 = Vomiting  Intensity  1. Sitting to supine 0  2. Supine to L side 0  3. Supine to R side 0  4. Supine to sitting 0  5. L Hallpike-Dix 0  6. Up from L  0  7. R Hallpike-Dix 2  8. Up from R  2  9. Sitting, head tipped to L knee 0  10. Head up from L knee 0  11. Sitting, head tipped to R knee 0  12. Head up from R knee 0  13. Sitting head turns x5 0  14.Sitting head nods x5 0  15. In stance, 180 turn to L  0  16. In stance, 180 turn to R 0   VESTIBULAR TREATMENT:                                                                                                    Canalith Repositioning: Epley Right: Number of Reps: 1, Response to Treatment: symptoms improved, and  Comment: small amplitude nystagmus noted on R side with duration < 10 seconds, performed maneuver, given time restraints unable to recheck   PATIENT EDUCATION: Education details: POC, goal collaboration, examination findings Person educated: Patient Education method: Explanation Education comprehension: verbalized understanding and needs further education  HOME EXERCISE PROGRAM:  GOALS: Goals reviewed with patient? Yes  LONG TERM GOALS: Target date: .12/18/2022   Patient will tolerate the Dix-Hallpike Maneuver to the R without reports of dizziness or spinning to indicate resolution of BPPV in order for patient to return to activities of daily living.   Baseline: positive R [employer]  Goal status: INITIAL  2.  FGA to be assessed / LTG written as indicated Baseline: To be assessed Goal status: INITIAL  3.  mCTSIB versus SOT to be assessed / LTG written as indicated Baseline: To be assessed Goal status: INITIAL   ASSESSMENT:  CLINICAL IMPRESSION: Patient is a 72 y.o. 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 Patient treated 1x with Epley maneuver. Recommend follow up to continue address remaining vestibular deficits as well as balance screen to ensure no other major deficits.   OBJECTIVE IMPAIRMENTS: Abnormal gait, decreased balance, and dizziness.   ACTIVITY LIMITATIONS: squatting, bed mobility, and reach over head  PARTICIPATION LIMITATIONS: cleaning, community activity, and yard work  PERSONAL FACTORS: Time since onset of injury/illness/exacerbation and 1-2 comorbidities: see above  are also affecting patient's functional outcome.   REHAB POTENTIAL: Good  CLINICAL DECISION MAKING: Stable/uncomplicated  EVALUATION COMPLEXITY:  Low   PLAN:  PT FREQUENCY: 1x/week  PT DURATION: 4 weeks  PLANNED INTERVENTIONS: Therapeutic exercises, Therapeutic activity, Neuromuscular re-education, Balance training, Gait training, Patient/Family education, Self Care, Vestibular training, Canalith repositioning, and Re-evaluation  PLAN FOR NEXT SESSION: reassess R Dix Hallpike and treat as indicated, assess FGA + mCTSIB    Carmelia Bake, PT, DPT 10/30/2022, 9:11 AM

## 2022-10-30 ENCOUNTER — Encounter: Payer: Self-pay | Admitting: Physical Therapy

## 2022-11-14 ENCOUNTER — Ambulatory Visit: Payer: Medicare Other | Admitting: Physical Therapy

## 2022-11-14 ENCOUNTER — Encounter: Payer: Self-pay | Admitting: Physical Therapy

## 2022-11-14 VITALS — BP 148/82 | HR 65

## 2022-11-14 DIAGNOSIS — R42 Dizziness and giddiness: Secondary | ICD-10-CM | POA: Diagnosis not present

## 2022-11-14 DIAGNOSIS — R2681 Unsteadiness on feet: Secondary | ICD-10-CM

## 2022-11-14 DIAGNOSIS — H8303 Labyrinthitis, bilateral: Secondary | ICD-10-CM | POA: Diagnosis not present

## 2022-11-14 DIAGNOSIS — R2689 Other abnormalities of gait and mobility: Secondary | ICD-10-CM

## 2022-11-14 NOTE — Therapy (Signed)
OUTPATIENT PHYSICAL THERAPY VESTIBULAR TREATMENT  Patient Name: Joseph Walker MRN: [id number] DOB:09-Nov-1950, 72 y.o., male Today's Date: 11/14/2022  END OF SESSION:  PT End of Session - 11/14/22 1229     Visit Number 2    Number of Visits 5    Date for PT Re-Evaluation 12/18/22    Authorization Type Medicare    PT Start Time 1228    PT Stop Time 1307    PT Time Calculation (min) 39 min    Equipment Utilized During Treatment Gait belt    Activity Tolerance Patient tolerated treatment well    Behavior During Therapy WFL for tasks assessed/performed            Past Medical History:  Diagnosis Date   Diabetes mellitus without complication (HCC)    Glaucoma    Hyperlipemia    Hypertension    Tachycardia    Past Surgical History:  Procedure Laterality Date   TUMOR REMOVAL Left    Left Breast   Patient Active Problem List   Diagnosis Date Noted   Abdominal bruit 10/07/2022   Viral labyrinthitis of both ears 10/06/2022   Screening for colon cancer 07/16/2022   Benign prostatic hyperplasia without lower urinary tract symptoms 07/16/2022   Stage 3a chronic kidney disease (HCC) 05/06/2022   Allergic rhinitis 11/27/2021   Current moderate episode of major depressive disorder without prior episode (HCC) 08/28/2021   Gastroesophageal reflux disease with esophagitis without hemorrhage 07/07/2020   Hyperlipidemia LDL goal <70 07/07/2020   Colon cancer screening 07/04/2020   Primary hypertension 07/04/2020   Diabetes 1.5, managed as type 1 (HCC) 07/04/2020    PCP: Etta Grandchild, MD REFERRING PROVIDER: Etta Grandchild, MD  REFERRING DIAG: H83.03 (ICD-10-CM) - Viral labyrinthitis of both ears R42 (ICD-10-CM) - Vertigo  THERAPY DIAG:  Unsteadiness on feet  Other abnormalities of gait and mobility  Dizziness and giddiness  ONSET DATE: 10/09/2022 (referral date)  Rationale for Evaluation and Treatment: Rehabilitation  SUBJECTIVE:   SUBJECTIVE STATEMENT: Patient  reports he has no longer been taking his meclizine and has had no new episodes of dizziness. Denies falls/near falls.  Pt accompanied by: self  PERTINENT HISTORY: diabetes, chronic renal disease stage 2  PAIN:  Are you having pain? No  PRECAUTIONS: Fall  RED FLAGS: None   WEIGHT BEARING RESTRICTIONS: No  FALLS: Has patient fallen in last 6 months? No  LIVING ENVIRONMENT: Lives with: lives alone Lives in: House/apartment Stairs: Yes: Internal: 15 steps; on right going up Has following equipment at home: Grab bars  PLOF: Independent - retired  PATIENT GOALS: "Patient reports goal is to find out if he can get a cure for dizziness."   OBJECTIVE:   DIAGNOSTIC FINDINGS: 11/11/2021 CT Head wo Contrast: WNL  VESTIBULAR TREATMENT:                                                                                                    Vitals:   11/14/22 1236  BP: (!) 148/82  Pulse: 65    Canalith Repositioning:  Epley Right: Number of Reps: 3,  Response to Treatment: symptoms improved, and Comment: small amplitude nystagmus noted on R side with duration < 10 seconds, rechecked on fourth attempt and nystagmus absent and patient reports no symptoms   TherAct:   FOTO: improved to 67    M-CTSIB  Condition 1: Firm Surface, EO 30 Sec, Normal Sway  Condition 2: Firm Surface, EC 30 Sec, Normal Sway  Condition 3: Foam Surface, EO 30 Sec, Normal Sway  Condition 4: Foam Surface, EC 30 Sec, Min Sway      [facility] PT Assessment - 11/14/22 0001       Functional Gait  Assessment   Gait assessed  Yes    Gait Level Surface Walks 20 ft in less than 5.5 sec, no assistive devices, good speed, no evidence for imbalance, normal gait pattern, deviates no more than 6 in outside of the 12 in walkway width.    Change in Gait Speed Able to smoothly change walking speed without loss of balance or gait deviation. Deviate no more than 6 in outside of the 12 in walkway width.    Gait with Horizontal Head  Turns Performs head turns smoothly with no change in gait. Deviates no more than 6 in outside 12 in walkway width    Gait with Vertical Head Turns Performs head turns with no change in gait. Deviates no more than 6 in outside 12 in walkway width.    Gait and Pivot Turn Pivot turns safely within 3 sec and stops quickly with no loss of balance.    Step Over Obstacle Is able to step over 2 stacked shoe boxes taped together (9 in total height) without changing gait speed. No evidence of imbalance.    Gait with Narrow Base of Support Is able to ambulate for 10 steps heel to toe with no staggering.    Gait with Eyes Closed Walks 20 ft, no assistive devices, good speed, no evidence of imbalance, normal gait pattern, deviates no more than 6 in outside 12 in walkway width. Ambulates 20 ft in less than 7 sec.    Ambulating Backwards Walks 20 ft, no assistive devices, good speed, no evidence for imbalance, normal gait    Steps Alternating feet, no rail.    Total Score 30    FGA comment: 30/30 = not a falls risk             PATIENT EDUCATION: Education details: examination findings Person educated: Patient Education method: Explanation Education comprehension: verbalized understanding and needs further education  HOME EXERCISE PROGRAM:  GOALS: Goals reviewed with patient? Yes  LONG TERM GOALS: Target date: .12/18/2022   Patient will tolerate the Dix-Hallpike Maneuver to the R without reports of dizziness or spinning to indicate resolution of BPPV in order for patient to return to activities of daily living.   Baseline: positive R Dix Hallpike Goal status: INITIAL  2.  FGA to be assessed / LTG written as indicated Baseline: Patient scored 30/30 Goal status: Discontinued - no deficit  3.  mCTSIB versus SOT to be assessed / LTG written as indicated Baseline: 120/120 Goal status: Discontinued - no deficit   ASSESSMENT:  CLINICAL IMPRESSION: Session emphasized skilled PT assessment of  residual signs of R posterior canal BPPV; a few beats of nystagmus noted and patient treated 3x Epley Maneuver with symptoms dissipating each round. On final assessment of of R Dix Hallpike no nystagmus noted and patient asymptomatic. Assessed mCTSIB and FGA; patient scored full points on each test indicating no major underlying balance  deficit. If BPPV resolved next session, will plan for early D/C.   OBJECTIVE IMPAIRMENTS: Abnormal gait, decreased balance, and dizziness.   ACTIVITY LIMITATIONS: squatting, bed mobility, and reach over head  PARTICIPATION LIMITATIONS: cleaning, community activity, and yard work  PERSONAL FACTORS: Time since onset of injury/illness/exacerbation and 1-2 comorbidities: see above  are also affecting patient's functional outcome.   REHAB POTENTIAL: Good  CLINICAL DECISION MAKING: Stable/uncomplicated  EVALUATION COMPLEXITY: Low   PLAN:  PT FREQUENCY: 1x/week  PT DURATION: 4 weeks  PLANNED INTERVENTIONS: Therapeutic exercises, Therapeutic activity, Neuromuscular re-education, Balance training, Gait training, Patient/Family education, Self Care, Vestibular training, Canalith repositioning, and Re-evaluation  PLAN FOR NEXT SESSION: reassess R Gilberto Better and treat as indicated, if resolved plan for D/C   Carmelia Bake, PT, DPT 11/14/2022, 1:13 PM

## 2022-11-21 ENCOUNTER — Ambulatory Visit: Payer: Medicare Other | Admitting: Physical Therapy

## 2022-11-21 ENCOUNTER — Encounter: Payer: Self-pay | Admitting: Physical Therapy

## 2022-11-21 VITALS — BP 140/82 | HR 69

## 2022-11-21 DIAGNOSIS — R42 Dizziness and giddiness: Secondary | ICD-10-CM | POA: Diagnosis not present

## 2022-11-21 DIAGNOSIS — R2681 Unsteadiness on feet: Secondary | ICD-10-CM

## 2022-11-21 DIAGNOSIS — R2689 Other abnormalities of gait and mobility: Secondary | ICD-10-CM | POA: Diagnosis not present

## 2022-11-21 DIAGNOSIS — H8303 Labyrinthitis, bilateral: Secondary | ICD-10-CM | POA: Diagnosis not present

## 2022-11-21 NOTE — Therapy (Signed)
OUTPATIENT PHYSICAL THERAPY VESTIBULAR TREATMENT  Patient Name: Joseph Walker MRN: [id number] DOB:1950-11-11, 72 y.o., male Today's Date: 11/21/2022  END OF SESSION:  PT End of Session - 11/21/22 1331     Visit Number 3    Number of Visits 5    Date for PT Re-Evaluation 12/18/22    Authorization Type Medicare    PT Start Time 1330    PT Stop Time 1357    PT Time Calculation (min) 27 min    Equipment Utilized During Treatment Gait belt    Activity Tolerance Patient tolerated treatment well    Behavior During Therapy WFL for tasks assessed/performed            Past Medical History:  Diagnosis Date   Diabetes mellitus without complication (HCC)    Glaucoma    Hyperlipemia    Hypertension    Tachycardia    Past Surgical History:  Procedure Laterality Date   TUMOR REMOVAL Left    Left Breast   Patient Active Problem List   Diagnosis Date Noted   Abdominal bruit 10/07/2022   Viral labyrinthitis of both ears 10/06/2022   Screening for colon cancer 07/16/2022   Benign prostatic hyperplasia without lower urinary tract symptoms 07/16/2022   Stage 3a chronic kidney disease (HCC) 05/06/2022   Allergic rhinitis 11/27/2021   Current moderate episode of major depressive disorder without prior episode (HCC) 08/28/2021   Gastroesophageal reflux disease with esophagitis without hemorrhage 07/07/2020   Hyperlipidemia LDL goal <70 07/07/2020   Colon cancer screening 07/04/2020   Primary hypertension 07/04/2020   Diabetes 1.5, managed as type 1 (HCC) 07/04/2020    PCP: Etta Grandchild, MD REFERRING PROVIDER: Etta Grandchild, MD  REFERRING DIAG: H83.03 (ICD-10-CM) - Viral labyrinthitis of both ears R42 (ICD-10-CM) - Vertigo  THERAPY DIAG:  Unsteadiness on feet  Other abnormalities of gait and mobility  Dizziness and giddiness  ONSET DATE: 10/09/2022 (referral date)  Rationale for Evaluation and Treatment: Rehabilitation  SUBJECTIVE:   SUBJECTIVE STATEMENT: Patient  reports he has no longer been taking his meclizine and has had no new episodes of dizziness. Denies falls/near falls.  Pt accompanied by: self  PERTINENT HISTORY: diabetes, chronic renal disease stage 2  PAIN:  Are you having pain? No  PRECAUTIONS: Fall  RED FLAGS: None   WEIGHT BEARING RESTRICTIONS: No  FALLS: Has patient fallen in last 6 months? No  LIVING ENVIRONMENT: Lives with: lives alone Lives in: House/apartment Stairs: Yes: Internal: 15 steps; on right going up Has following equipment at home: Grab bars  PLOF: Independent - retired  PATIENT GOALS: "Patient reports goal is to find out if he can get a cure for dizziness."   OBJECTIVE:   DIAGNOSTIC FINDINGS: 11/11/2021 CT Head wo Contrast: WNL  VESTIBULAR TREATMENT:                                                                                                    Vitals:   11/21/22 1332  BP: (!) 140/82  Pulse: 69   Canalith Repositioning:  Modified with use or risers on end  legs of mat to place mat in trendelenburg on attempts 2-4 to maximize proper cervical extension  Epley Right: Number of Reps: 4, Response to Treatment: symptoms improved, and Comment: small amplitude nystagmus noted on R side with duration < 10-15 seconds, used a vibrator on final two attempts on mastoid process to assist crystals moving through canal, rechecked at end of session and a few beats of nystagmus noted though patient denies dizziness (held final rep due to time constraints and patient tolerance)   PATIENT EDUCATION: Education details: examination findings Person educated: Patient Education method: Explanation Education comprehension: verbalized understanding and needs further education  HOME EXERCISE PROGRAM:  GOALS: Goals reviewed with patient? Yes  LONG TERM GOALS: Target date: .12/18/2022   Patient will tolerate the Dix-Hallpike Maneuver to the R without reports of dizziness or spinning to indicate resolution of BPPV in  order for patient to return to activities of daily living.   Baseline: positive R Dix Hallpike Goal status: INITIAL  2.  FGA to be assessed / LTG written as indicated Baseline: Patient scored 30/30 Goal status: Discontinued - no deficit  3.  mCTSIB versus SOT to be assessed / LTG written as indicated Baseline: 120/120 Goal status: Discontinued - no deficit   ASSESSMENT:  CLINICAL IMPRESSION: Patient continues to present with ongoing nystagmus with right Gilberto Better. Treated 4x with Epley as patient tolerated well; notably patient did not report symptoms in positions 2-3 on maneuver. Used modified mat positioning and use of vibrator on mastoid process given lack of resolution of symptoms thus far. 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 Continue POC.  OBJECTIVE IMPAIRMENTS: Abnormal gait, decreased balance, and dizziness.   ACTIVITY LIMITATIONS: squatting, bed mobility, and reach over head  PARTICIPATION LIMITATIONS: cleaning, community activity, and yard work  PERSONAL FACTORS: Time since onset of injury/illness/exacerbation and 1-2 comorbidities: see above  are also affecting patient's functional outcome.   REHAB POTENTIAL: Good  CLINICAL DECISION MAKING: Stable/uncomplicated  EVALUATION COMPLEXITY: Low   PLAN:  PT FREQUENCY: 1x/week  PT DURATION: 4 weeks  PLANNED INTERVENTIONS: Therapeutic exercises, Therapeutic activity, Neuromuscular re-education, Balance training, Gait training, Patient/Family education, Self Care, Vestibular training, Canalith repositioning, and Re-evaluation  PLAN FOR NEXT SESSION: reassess R Dix Hallpike and treat as indicated, consider providing habituation/adaptation if unable to resolve with Caryl Bis versus VOR x 1   Carmelia Bake, PT, DPT 11/21/2022, 2:02 PM

## 2022-11-28 ENCOUNTER — Ambulatory Visit: Payer: Medicare Other | Attending: Internal Medicine | Admitting: Physical Therapy

## 2022-11-28 ENCOUNTER — Encounter: Payer: Self-pay | Admitting: Physical Therapy

## 2022-11-28 VITALS — BP 127/85 | HR 70

## 2022-11-28 DIAGNOSIS — R42 Dizziness and giddiness: Secondary | ICD-10-CM | POA: Diagnosis not present

## 2022-11-28 DIAGNOSIS — R2689 Other abnormalities of gait and mobility: Secondary | ICD-10-CM | POA: Diagnosis not present

## 2022-11-28 DIAGNOSIS — R2681 Unsteadiness on feet: Secondary | ICD-10-CM | POA: Diagnosis not present

## 2022-11-28 NOTE — Therapy (Signed)
OUTPATIENT PHYSICAL THERAPY VESTIBULAR TREATMENT / DISCHARGE  Patient Name: Joseph Walker MRN: [id number] DOB:02/03/1951, 71 y.o., male Today's Date: 11/28/2022  PHYSICAL THERAPY DISCHARGE SUMMARY  Visits from Start of Care: 4  Current functional level related to goals / functional outcomes: Achieved goals   Remaining deficits: None   Education / Equipment: When to return to PT   Patient agrees to discharge. Patient goals were met. Patient is being discharged due to meeting the stated rehab goals.   END OF SESSION:  PT End of Session - 11/28/22 1358     Visit Number 4    Number of Visits 5    Date for PT Re-Evaluation 12/18/22    Authorization Type Medicare    PT Start Time 1357    PT Stop Time 1412    PT Time Calculation (min) 15 min    Equipment Utilized During Treatment Gait belt    Activity Tolerance Patient tolerated treatment well    Behavior During Therapy WFL for tasks assessed/performed            Past Medical History:  Diagnosis Date   Diabetes mellitus without complication (HCC)    Glaucoma    Hyperlipemia    Hypertension    Tachycardia    Past Surgical History:  Procedure Laterality Date   TUMOR REMOVAL Left    Left Breast   Patient Active Problem List   Diagnosis Date Noted   Abdominal bruit 10/07/2022   Viral labyrinthitis of both ears 10/06/2022   Screening for colon cancer 07/16/2022   Benign prostatic hyperplasia without lower urinary tract symptoms 07/16/2022   Stage 3a chronic kidney disease (HCC) 05/06/2022   Allergic rhinitis 11/27/2021   Current moderate episode of major depressive disorder without prior episode (HCC) 08/28/2021   Gastroesophageal reflux disease with esophagitis without hemorrhage 07/07/2020   Hyperlipidemia LDL goal <70 07/07/2020   Colon cancer screening 07/04/2020   Primary hypertension 07/04/2020   Diabetes 1.5, managed as type 1 (HCC) 07/04/2020    PCP: Etta Grandchild, MD REFERRING PROVIDER: Etta Grandchild, MD  REFERRING DIAG: H83.03 (ICD-10-CM) - Viral labyrinthitis of both ears R42 (ICD-10-CM) - Vertigo  THERAPY DIAG:  Unsteadiness on feet  Other abnormalities of gait and mobility  Dizziness and giddiness  ONSET DATE: 10/09/2022 (referral date)  Rationale for Evaluation and Treatment: Rehabilitation  SUBJECTIVE:   SUBJECTIVE STATEMENT: Patient reports he is doing well and has had no dizziness over the last week. Denies falls/near falls.  Pt accompanied by: self  PERTINENT HISTORY: diabetes, chronic renal disease stage 2  PAIN:  Are you having pain? No  PRECAUTIONS: Fall  RED FLAGS: None   WEIGHT BEARING RESTRICTIONS: No  FALLS: Has patient fallen in last 6 months? No  LIVING ENVIRONMENT: Lives with: lives alone Lives in: House/apartment Stairs: Yes: Internal: 15 steps; on right going up Has following equipment at home: Grab bars  PLOF: Independent - retired  PATIENT GOALS: "Patient reports goal is to find out if he can get a cure for dizziness."   OBJECTIVE:   DIAGNOSTIC FINDINGS: 11/11/2021 CT Head wo Contrast: WNL  VESTIBULAR TREATMENT:  Vitals:   11/28/22 1402  BP: 127/85  Pulse: 70   TherAct (Assessed Goals):  Positional testing: R Gilberto Better all clear  FOTO: 59   PATIENT EDUCATION: Education details: examination findings, when to return to PT, progress towards goals Person educated: Patient Education method: Explanation Education comprehension: verbalized understanding  HOME EXERCISE PROGRAM:  NOT INDICATED  GOALS: Goals reviewed with patient? Yes  LONG TERM GOALS: Target date: .12/18/2022   Patient will tolerate the Dix-Hallpike Maneuver to the R without reports of dizziness or spinning to indicate resolution of BPPV in order for patient to return to activities of daily living.   Baseline: positive R Dix Hallpike; negative R  Dix Hallpike Goal status: MET  2.  FGA to be assessed / LTG written as indicated Baseline: Patient scored 30/30 Goal status: Discontinued - no deficit  3.  mCTSIB versus SOT to be assessed / LTG written as indicated Baseline: 120/120 Goal status: Discontinued - no deficit   ASSESSMENT:  CLINICAL IMPRESSION: Patient is being D/C from PT due to full resolution of symptoms, no complaints of dizziness, and return to PLOF. Negative Dix Hallpike in today's session consistent with subjective report. Patient aware when to return to PT with new referral if symptoms were to reoccur.   OBJECTIVE IMPAIRMENTS: Abnormal gait, decreased balance, and dizziness.   ACTIVITY LIMITATIONS: squatting, bed mobility, and reach over head  PARTICIPATION LIMITATIONS: cleaning, community activity, and yard work  PERSONAL FACTORS: Time since onset of injury/illness/exacerbation and 1-2 comorbidities: see above  are also affecting patient's functional outcome.   REHAB POTENTIAL: Good  CLINICAL DECISION MAKING: Stable/uncomplicated  EVALUATION COMPLEXITY: Low   PLAN:  PT FREQUENCY: 1x/week  PT DURATION: 4 weeks  PLANNED INTERVENTIONS: Therapeutic exercises, Therapeutic activity, Neuromuscular re-education, Balance training, Gait training, Patient/Family education, Self Care, Vestibular training, Canalith repositioning, and Re-evaluation  PLAN FOR NEXT SESSION:  not indicated - patient D/C with updated HEP  Carmelia Bake, PT, DPT 11/28/2022, 2:27 PM

## 2022-12-05 ENCOUNTER — Ambulatory Visit: Payer: Medicare Other | Admitting: Physical Therapy

## 2022-12-18 ENCOUNTER — Telehealth: Payer: Self-pay | Admitting: Internal Medicine

## 2022-12-18 NOTE — Telephone Encounter (Signed)
PCP has not ordered a [place].

## 2022-12-18 NOTE — Telephone Encounter (Signed)
Patient called and said he had a ultrasound ordered by his doctor. He said he wasn't sure what it is for. I don't see an ultrasound order in his chart. He would like a call back at [phone].

## 2022-12-22 ENCOUNTER — Ambulatory Visit (HOSPITAL_COMMUNITY)
Admission: RE | Admit: 2022-12-22 | Discharge: 2022-12-22 | Disposition: A | Discharge status: Home / Self Care | Payer: Medicare Other | Source: Ambulatory Visit | Attending: Internal Medicine | Admitting: Internal Medicine

## 2022-12-22 DIAGNOSIS — I10 Essential (primary) hypertension: Secondary | ICD-10-CM | POA: Diagnosis not present

## 2022-12-22 DIAGNOSIS — R0989 Other specified symptoms and signs involving the circulatory and respiratory systems: Secondary | ICD-10-CM | POA: Diagnosis not present

## 2022-12-31 ENCOUNTER — Telehealth: Payer: Self-pay | Admitting: Internal Medicine

## 2022-12-31 NOTE — Telephone Encounter (Signed)
Patient would like the results of his last test that Dr. Yetta Barre ordered mailed to him at his home address.  He does not use My Chart.

## 2023-01-02 ENCOUNTER — Telehealth: Payer: Self-pay | Admitting: Internal Medicine

## 2023-01-02 NOTE — Telephone Encounter (Signed)
Pt called wanting referrals sent to:  [facility] Dr. Barbara Cower [facility] [address], [address], [place], [place], [address] T [phone] F [phone]  Endocrinology Dr. Sharl Ma [address] [place], [place], [place] [address] [phone]  Urologist Dr. Excell Seltzer. [facility], [address] [place], [place], [place] [address] [phone]

## 2023-02-20 ENCOUNTER — Encounter: Payer: Self-pay | Admitting: Internal Medicine

## 2023-02-23 ENCOUNTER — Other Ambulatory Visit: Payer: Self-pay | Admitting: Internal Medicine

## 2023-02-23 DIAGNOSIS — N400 Enlarged prostate without lower urinary tract symptoms: Secondary | ICD-10-CM

## 2023-02-23 DIAGNOSIS — E139 Other specified diabetes mellitus without complications: Secondary | ICD-10-CM

## 2023-03-06 ENCOUNTER — Telehealth: Payer: Self-pay | Admitting: Internal Medicine

## 2023-03-06 NOTE — Telephone Encounter (Signed)
Patient is aware of Dr. Melvyn Novas comments and gave a verbal understanding.

## 2023-03-06 NOTE — Telephone Encounter (Signed)
Patient states that his freestyle sensor is going off around 5am saying that his sugar level are 60. He hasn't taken his insulin the last 2 days and his sugars is taping back up. He is very concerned about his sugar levels dropping low while he's asleep. Please advise.

## 2023-03-06 NOTE — Telephone Encounter (Signed)
Pt calling wanting to speak about his Insulin when he takes it his blood sugar levels drops down below 60. Pt is wanting a call back about what he need to do please advise.

## 2023-03-06 NOTE — Telephone Encounter (Signed)
Ok to hold on taking the toujeo for now, document sugars QID over the weekend, and call Dr Yetta Barre on Monday with results plesae

## 2023-04-02 ENCOUNTER — Telehealth: Payer: Self-pay | Admitting: Internal Medicine

## 2023-04-02 NOTE — Telephone Encounter (Signed)
 Copied from CRM [id number]. Topic: Clinical - Prescription Issue >> Apr 02, 2023  1:29 PM Joseph Walker wrote: Reason for CRM: Patient called stated that he is having issues getting his prescription Continuous Glucose Sensor (FREESTYLE LIBRE 2 SENSOR) MISC [[id number]]. He went to [facility] [address], [place], [place] [address] - They said that he needs prior-authorization and will not be able to get the medication until Monday. He stated the senor is about to expire in 2hrs.

## 2023-04-03 ENCOUNTER — Telehealth: Payer: Self-pay

## 2023-04-03 ENCOUNTER — Other Ambulatory Visit (HOSPITAL_COMMUNITY): Payer: Self-pay

## 2023-04-03 ENCOUNTER — Other Ambulatory Visit: Payer: Self-pay | Admitting: Internal Medicine

## 2023-04-03 DIAGNOSIS — E139 Other specified diabetes mellitus without complications: Secondary | ICD-10-CM

## 2023-04-03 NOTE — Telephone Encounter (Signed)
 Pharmacy Patient Advocate Encounter   Received notification from Pt Calls Messages that prior authorization for Freestyle Libre 2 Sensor is required/requested.   Insurance verification completed.   The patient is insured through [employer] .   Per test claim: PA required; PA submitted to above mentioned insurance via CoverMyMeds Key/confirmation #/EOC [id number] Status is pending

## 2023-04-03 NOTE — Telephone Encounter (Signed)
 Call and spoke with patient to clarify what's going on. The patient advised me that the insurance information that we have on file isn't up to date so I connected him with tara to update his insurance information. And A message has been sent to the PA team to have his PA done. He gave a verbal understanding.

## 2023-04-03 NOTE — Telephone Encounter (Signed)
 Reason for CRM: Patient called in stating someone advised him that the prior auth for his medication was sent to insurance, checked with insurance and was able to confirm nothing was sent or received . Patient stated this is concerning for a patient to not be informed of needing a prior auth until they go to fill medication and then have to go without, also stated a turn around time would be nice. Called [facility] was advised the only way to reach prior auth team is through [email or web address] and patient is older and does not use email. Stated he will purchase this prescription and pay out of pocket if that is a option for now because he can not go without medication and would hope that prior shara is completed and pushed through insurance and pharmacy by the next time he needs to refill medication so he will not have this problem again

## 2023-04-03 NOTE — Telephone Encounter (Signed)
 May you please assist me with the PA that's needed.

## 2023-04-06 NOTE — Telephone Encounter (Signed)
 Pharmacy Patient Advocate Encounter  Received notification from [facility] that Prior Authorization for [facility] 2 Sensor has been APPROVED from 03/25/23 to 03/23/24   PA #/Case ID/Reference #: [id number]

## 2023-04-06 NOTE — Telephone Encounter (Signed)
 Patient has been made aware.

## 2023-04-07 ENCOUNTER — Other Ambulatory Visit (HOSPITAL_COMMUNITY): Payer: Self-pay

## 2023-04-07 ENCOUNTER — Other Ambulatory Visit: Payer: Self-pay | Admitting: Internal Medicine

## 2023-04-07 DIAGNOSIS — E139 Other specified diabetes mellitus without complications: Secondary | ICD-10-CM

## 2023-04-16 ENCOUNTER — Other Ambulatory Visit: Payer: Self-pay | Admitting: Internal Medicine

## 2023-04-16 DIAGNOSIS — I10 Essential (primary) hypertension: Secondary | ICD-10-CM

## 2023-04-16 DIAGNOSIS — E139 Other specified diabetes mellitus without complications: Secondary | ICD-10-CM

## 2023-04-16 NOTE — Telephone Encounter (Signed)
Copied from CRM [id number]. Topic: Clinical - Medication Refill >> Apr 16, 2023 12:01 PM Theodis Sato wrote: Most Recent Primary Care Visit:  Provider: Etta Grandchild  Department: [facility]  Visit Type: OFFICE VISIT  Date: 10/06/2022  Medication: Metoprolol Succinate 25 MG  Has the patient contacted their pharmacy? No- Out of re-fills (Agent: If no, request that the patient contact the pharmacy for the refill. If patient does not wish to contact the pharmacy document the reason why and proceed with request.) (Agent: If yes, when and what did the pharmacy advise?)  Is this the correct pharmacy for this prescription? Yes If no, delete pharmacy and type the correct one.  This is the patient's preferred pharmacy:  [facility] [phone] - [place], [place] - [address] AT [facility] [address] [place] [address] Phone: [phone] Fax: [phone]    Has the prescription been filled recently? Yes  Is the patient out of the medication? No  Has the patient been seen for an appointment in the last year OR does the patient have an upcoming appointment? Yes  Can we respond through MyChart? No  Agent: Please be advised that Rx refills may take up to 3 business days. We ask that you follow-up with your pharmacy.

## 2023-04-20 ENCOUNTER — Ambulatory Visit: Payer: Self-pay | Admitting: Internal Medicine

## 2023-04-20 NOTE — Telephone Encounter (Signed)
Copied from CRM [id number]. Topic: Clinical - Prescription Issue >> Apr 20, 2023  9:55 AM Shelbie Proctor wrote: Reason for CRM: Patient [phone] states FreeStyle Libre blood glucose system was cancel and patient needs it to monitor his sugar level. Patient is unsure if the refill is Continuous Blood Gluc Receiver (FREESTYLE LIBRE 2 READER) DEVI or Continuous Glucose Sensor (FREESTYLE LIBRE 2 SENSOR) MISC. Patient will be out of medication soon. Patient wants to speak with nurse on clarification. Scheduled an appointment for 05/28/23.  [facility] [phone] -[address] [place] [address] Phone: [phone] Fax: [phone]  01/27 1048- Called patientto advise that his Freestyle [place] 2 sensor prior auth was approved on 01/13. For dates 03/25/23-12-31/25. No answer, unable to leave vm

## 2023-04-20 NOTE — Telephone Encounter (Signed)
Have spoken with patient and clarified everything that's going on with his medication and PA. Patient has been scheduled in office this week.

## 2023-04-20 NOTE — Telephone Encounter (Signed)
Copied from CRM [id number]. Topic: Clinical - Medication Question >> Apr 20, 2023 10:08 AM Orinda Kenner C wrote: Reason for CRM: Patient  [phone] states he scared if doesn't have FreeStyle Libre blood glucose system needs it to monitor his sugar level especially in the morning because his sugar levels are low and and won't even know it. Patient is unsure if the refill is Continuous Blood Gluc Receiver (FREESTYLE LIBRE 2 READER) DEVI or ContinuousGlucose Sensor (FREESTYLE LIBRE 2 SENSOR) MISC. Patient will be out of medication soon. Patient wants to speak with nurse on clarification and medications concerns. Scheduled an appointment for 05/28/23.

## 2023-04-20 NOTE — Telephone Encounter (Addendum)
Patient returned call and this RN informed patient that the prior auth for  [facility] 2 sensor was approved on 1/13.  Patient stated that he went to the pharmacy on Friday 1/24 and they told him the prescription was cancelled by Dr. Yetta Barre. Patient would like this issue to be resolved ASAP so that he can continue checking his blood sugars. The last time he checked it was 3-4 days ago  Patient also mentioned that he is not taking his Toujeo as prescribed. He has been cutting the dose in half. He thinks that it makes his blood sugars too low. He reported that his blood sugar has been as low as 60 after taking it.   Informed patient that a message will be sent and the office will follow up with him.

## 2023-04-21 ENCOUNTER — Ambulatory Visit (INDEPENDENT_AMBULATORY_CARE_PROVIDER_SITE_OTHER): Payer: Medicare PPO | Admitting: Family Medicine

## 2023-04-21 ENCOUNTER — Encounter: Payer: Self-pay | Admitting: Family Medicine

## 2023-04-21 VITALS — BP 126/80 | HR 83 | Ht 69.0 in | Wt 165.2 lb

## 2023-04-21 DIAGNOSIS — I10 Essential (primary) hypertension: Secondary | ICD-10-CM | POA: Diagnosis not present

## 2023-04-21 DIAGNOSIS — E785 Hyperlipidemia, unspecified: Secondary | ICD-10-CM | POA: Diagnosis not present

## 2023-04-21 DIAGNOSIS — Z79899 Other long term (current) drug therapy: Secondary | ICD-10-CM

## 2023-04-21 DIAGNOSIS — N1831 Chronic kidney disease, stage 3a: Secondary | ICD-10-CM | POA: Diagnosis not present

## 2023-04-21 DIAGNOSIS — E139 Other specified diabetes mellitus without complications: Secondary | ICD-10-CM | POA: Diagnosis not present

## 2023-04-21 MED ORDER — FREESTYLE LIBRE 2 READER DEVI: 1.0000 | Freq: Every day | 5 refills | Status: AC

## 2023-04-21 MED ORDER — FREESTYLE LIBRE 2 SENSOR MISC: 5 refills | Status: AC

## 2023-04-21 NOTE — Progress Notes (Signed)
Established Patient Office Visit  Subjective   Patient ID: Joseph Walker, male    DOB: 12-20-1950  Age: 73 y.o. MRN: [id number]  No chief complaint on file.   HPI 73 year old male presents for medication management today. Requesting refills of medications He is not fasting today. He sees Dr Sharyn Lull with cardiology every 3 months. Has been out of freestyle libre sensors for the last 2 weeks, states he is unsure about what his blood sugars are looking like. Reports that blood sugars have been getting down into the 30s and 40s with the higher dose of Toujeo, has brought himself down to 8 units of Toujeo once every other day. Denies symptomatic hyper or hypoglycemic episodes since doing the Toujeo 8 units every other day. Medical history as outlined below. Denies other concerns today  ROS Per HPI    Objective:     BP 126/80 (BP Location: Left Arm, Patient Position: Sitting)   Pulse 83   Ht 5\' 9"  (1.753 m)   Wt 165 lb 3.2 oz (74.9 kg)   SpO2 95%   BMI 24.40 kg/m   Physical Exam Vitals and nursing note reviewed.  Constitutional:      General: He is not in acute distress.    Appearance: Normal appearance.  HENT:     Head: Normocephalic and atraumatic.  Eyes:     Extraocular Movements: Extraocular movements intact.  Cardiovascular:     Rate and Rhythm: Normal rate and regular rhythm.     Pulses: Normal pulses.     Heart sounds: Normal heart sounds.  Pulmonary:     Effort: Pulmonary effort is normal. No respiratory distress.     Breath sounds: Normal breath sounds. No wheezing, rhonchi or rales.  Musculoskeletal:        General: Normal range of motion.     Cervical back: Normal range of motion.  Lymphadenopathy:     Cervical: No cervical adenopathy.  Skin:    General: Skin is warm and dry.     Capillary Refill: Capillary refill takes less than 2 seconds.  Neurological:     General: No focal deficit present.     Mental Status: He is alert and oriented to person,  place, and time.  Psychiatric:        Mood and Affect: Mood normal.        Behavior: Behavior normal.     No results found for any visits on 04/21/23.   The 10-year ASCVD risk score (Arnett DK, et al., 2019) is: 31.6%    Assessment & Plan:   Diabetes 1.5, managed as type 1 (HCC) -     CBC with Differential/Platelet; Future -     Comprehensive metabolic panel; Future -     Lipid panel; Future -     Hemoglobin A1c; Future -     FreeStyle Libre 2 Reader; 1 Act by Does not apply route daily.  Dispense: 2 each; Refill: 5 -     FreeStyle Libre 2 Sensor; Use every 14 days.  Dispense: 2 each; Refill: 5  Stage 3a chronic kidney disease (HCC) -     CBC with Differential/Platelet; Future -     Comprehensive metabolic panel; Future -     Hemoglobin A1c; Future  Hyperlipidemia LDL goal <70 -     Lipid panel; Future -     Hemoglobin A1c; Future  Primary hypertension -     CBC with Differential/Platelet; Future -     Comprehensive metabolic panel; Future -  Hemoglobin A1c; Future  Medication management -     CBC with Differential/Platelet; Future -     Comprehensive metabolic panel; Future -     Lipid panel; Future -     Hemoglobin A1c; Future     Return in about 3 months (around 07/20/2023) for med management.    Moshe Cipro, FNP

## 2023-04-21 NOTE — Patient Instructions (Addendum)
We are checking labs today, will be in contact with any results that require further attention  I have sent in refills of your freestyle libre 2 sensors   Follow up with [place] in 3 mos for medication management.

## 2023-05-24 ENCOUNTER — Other Ambulatory Visit: Payer: Self-pay | Admitting: Internal Medicine

## 2023-05-24 DIAGNOSIS — E139 Other specified diabetes mellitus without complications: Secondary | ICD-10-CM

## 2023-05-26 ENCOUNTER — Ambulatory Visit: Payer: Self-pay | Admitting: Internal Medicine

## 2023-05-26 NOTE — Telephone Encounter (Signed)
 Copied from CRM [id number]. Topic: Clinical - Medical Advice >> May 26, 2023  9:17 AM Fonda Kinder J wrote: Reason for CRM: Pt is out of insulin and his refill request was refused because he needs an appointment. I Offered to schedule him one but he wanted  to speak to a nurse to see what his other options were   Chief Complaint: med refill request denied Symptoms: denies Disposition: [] 911 / [] ED /[] Urgent Care (no appt availability in office) / [] Appointment(In office/virtual)/ []  [facility]/ [] Home Care/ [x] Refused Recommended Disposition /[] [facility]/ []  Follow-up with PCP Additional Notes: Pt reporting his refill request for Toujeo was denied, he only has enough for dose this morning but pt not been taking this medication as prescribed. Pt reporting he's adjusted regimen on his own, does not use Toujeo daily, takes smaller doses, since the med brings his blood sugar down too low, especially at night, interrupting sleep. Advised appt to discuss issues with med and make adjustments as necessary, pt declines in-person appt today and declines virtual appts. Scheduled appt for tomorrow morning with PCP office. Advised pt monitor for blood sugar changes and symptoms and call if any emerge. Pt verbalized understanding. Please call pt if further recommendations in the meantime.  Reason for Disposition  [1] Prescription refill request for ESSENTIAL medicine (i.e., likelihood of harm to patient if not taken) AND [2] triager unable to refill per department policy  Answer Assessment - Initial Assessment Questions 1. DRUG NAME: "What medicine do you need to have refilled?"     Toujeo 2. REFILLS REMAINING: "How many refills are remaining?" (Note: The label on the medicine or pill bottle will show how many refills are remaining. If there are no refills remaining, then a renewal may be needed.)     Have a little bit left 4. PRESCRIBING HCP: "Who prescribed it?" Reason: If prescribed by  specialist, call should be referred to that group.     Dr. Yetta Barre 5. SYMPTOMS: "Do you have any symptoms?"     denies  Blood sugar will drop down too low after 16 units of insulin, waking him up with alerts, knocked it down to 8 units at this time, still knocking units down real low, last visit with Dr. Yetta Barre the nurse said may need to change that. Understand that was canceled because need Not able to do a visit today or virtual visit Don't take it every day, may not need it for rest of week  Protocols used: Medication Refill and Renewal Call-A-AH

## 2023-05-26 NOTE — Telephone Encounter (Signed)
 Unable to refill medication until pt is seen in office. Pt will also need appt w PCP for continued refills

## 2023-05-27 ENCOUNTER — Encounter: Payer: Self-pay | Admitting: Family Medicine

## 2023-05-27 ENCOUNTER — Ambulatory Visit (INDEPENDENT_AMBULATORY_CARE_PROVIDER_SITE_OTHER): Admitting: Family Medicine

## 2023-05-27 VITALS — BP 132/90 | HR 75 | Temp 97.6°F | Ht 69.0 in | Wt 166.0 lb

## 2023-05-27 DIAGNOSIS — E162 Hypoglycemia, unspecified: Secondary | ICD-10-CM

## 2023-05-27 DIAGNOSIS — E139 Other specified diabetes mellitus without complications: Secondary | ICD-10-CM | POA: Diagnosis not present

## 2023-05-27 DIAGNOSIS — N1831 Chronic kidney disease, stage 3a: Secondary | ICD-10-CM

## 2023-05-27 MED ORDER — TOUJEO MAX SOLOSTAR 300 UNIT/ML ~~LOC~~ SOPN: 5.0000 [IU] | PEN_INJECTOR | Freq: Every day | SUBCUTANEOUS | 0 refills | Status: AC

## 2023-05-27 NOTE — Progress Notes (Signed)
 Subjective:     Patient ID: Joseph Walker, male    DOB: 02/16/1951, 73 y.o.   MRN: [id number]  Chief Complaint  Patient presents with   Diabetes    [place] in the morning, takes insulin. Was prescribed to take 16 units but changed to 8 units himself. Currently doing 8 units but thinks BS are still too low in the morning. Now has adjusted himself to take it once a week every other week. Now needs refill  BS drops in morning around 50    Diabetes Pertinent negatives for hypoglycemia include no dizziness. Pertinent negatives for diabetes include no blurred vision, no chest pain, no polydipsia and no weight loss.     History of Present Illness         He is here requesting insulin refill. He was seen by another provider here in January and provided a Toujeo refill but he did not get labs as ordered. No A1c since 06/2022  FBS range 40-130s.   Blood sugars throughout the day are as high as 244.  Using [employer].   States he is taking Toujeo 8 units some days. States his blood sugars drop too low if he takes his Toujeo daily.  States he is taking Xigduo XR 12-998 mg on the days he is taking Toujeo only.    He sees Dr. Sharyn Lull and states he takes all of his other medications.   He has not taken any medication today.      Health Maintenance Due  Topic Date Due   OPHTHALMOLOGY EXAM  Never done   Zoster Vaccines- Shingrix (1 of 2) Never done   Colonoscopy  Never done   COVID-19 Vaccine (3 - Pfizer risk series) 08/02/2019   INFLUENZA VACCINE  Never done   HEMOGLOBIN A1C  01/15/2023   Diabetic kidney evaluation - Urine ACR  05/07/2023   Medicare Annual Wellness (AWV)  06/10/2023    Past Medical History:  Diagnosis Date   Diabetes mellitus without complication (HCC)    Glaucoma    Hyperlipemia    Hypertension    Tachycardia     Past Surgical History:  Procedure Laterality Date   TUMOR REMOVAL Left    Left Breast    Family History  Problem Relation Age  of Onset   Glaucoma Mother    Cataracts Mother    Alzheimer's disease Mother    Lung disease Father    Alzheimer's disease Maternal Grandmother    Alzheimer's disease Maternal Grandfather     Social History   Socioeconomic History   Marital status: Single    Spouse name: Not on file   Number of children: Not on file   Years of education: Not on file   Highest education level: Not on file  Occupational History   Not on file  Tobacco Use   Smoking status: Never    Passive exposure: Never   Smokeless tobacco: Never  Vaping Use   Vaping status: Never Used  Substance and Sexual Activity   Alcohol use: Yes    Alcohol/week: 2.0 standard drinks of alcohol    Types: 2 Cans of beer per week   Drug use: Never   Sexual activity: Yes    Partners: Female  Other Topics Concern   Not on file  Social History Narrative   Not on file   Social Drivers of Health   Financial Resource Strain: High Risk (06/10/2022)   Overall Financial Resource Strain (CARDIA)    Difficulty of  Paying Living Expenses: Very hard  Food Insecurity: Food Insecurity Present (06/10/2022)   Hunger Vital Sign    Worried About Running Out of Food in the Last Year: Sometimes true    Ran Out of Food in the Last Year: Sometimes true  Transportation Needs: No Transportation Needs (06/10/2022)   PRAPARE - Administrator, Civil Service (Medical): No    Lack of Transportation (Non-Medical): No  Physical Activity: Inactive (06/10/2022)   Exercise Vital Sign    Days of Exercise per Week: 0 days    Minutes of Exercise per Session: 0 min  Stress: No Stress Concern Present (06/10/2022)   [employer] of Occupational Health - Occupational Stress Questionnaire    Feeling of Stress : Only a little  Social Connections: Socially Isolated (06/10/2022)   Social Connection and Isolation Panel [NHANES]    Frequency of Communication with Friends and Family: More than three times a week    Frequency of Social Gatherings  with Friends and Family: Once a week    Attends Religious Services: Never    Database administrator or Organizations: No    Attends Banker Meetings: Never    Marital Status: Never married  Intimate Partner Violence: Not At Risk (06/10/2022)   Humiliation, Afraid, Rape, and Kick questionnaire    Fear of Current or Ex-Partner: No    Emotionally Abused: No    Physically Abused: No    Sexually Abused: No    Outpatient Medications Prior to Visit  Medication Sig Dispense Refill   aspirin EC 81 MG tablet Take 1 tablet (81 mg total) by mouth daily. 90 tablet 1   atorvastatin (LIPITOR) 40 MG tablet TAKE 1 TABLET(40 MG) BY MOUTH DAILY 90 tablet 1   Continuous Glucose Receiver (FREESTYLE LIBRE 2 READER) DEVI 1 Act by Does not apply route daily. 2 each 5   Continuous Glucose Sensor (FREESTYLE LIBRE 2 SENSOR) MISC Use every 14 days. 2 each 5   diltiazem (CARDIZEM CD) 240 MG 24 hr capsule Take 240 mg by mouth daily.     guaiFENesin (MUCINEX) 600 MG 12 hr tablet Take 2 tablets (1,200 mg total) by mouth 2 (two) times daily as needed. 60 tablet 1   insulin glargine, 2 Unit Dial, (TOUJEO MAX SOLOSTAR) 300 UNIT/ML Solostar Pen Inject 20 Units into the skin daily. 6 mL 1   Insulin Pen Needle (BD PEN NEEDLE MICRO U/F) 32G X 6 MM MISC USE DAILY 100 each 1   losartan-hydrochlorothiazide (HYZAAR) 100-25 MG tablet Take 1 tablet by mouth daily.     meclizine (ANTIVERT) 25 MG tablet Take 0.5 tablets (12.5 mg total) by mouth 3 (three) times daily as needed for dizziness. 60 tablet 1   metoprolol succinate (TOPROL-XL) 25 MG 24 hr tablet Take 25 mg by mouth daily.     metoprolol succinate (TOPROL-XL) 50 MG 24 hr tablet Take 50 mg by mouth daily.     Metoprolol Succinate 25 MG CS24 Take by mouth.     promethazine (PHENERGAN) 12.5 MG tablet Take 1 tablet (12.5 mg total) by mouth every 6 (six) hours as needed for nausea or vomiting. 30 tablet 0   triamcinolone (NASACORT) 55 MCG/ACT AERO nasal inhaler Place  2 sprays into the nose daily. 1 each 12   XIGDUO XR 12-998 MG TB24 TAKE 1 TABLET BY MOUTH DAILY 90 tablet 0   cetirizine (ZYRTEC) 10 MG tablet Take 1 tablet (10 mg total) by mouth daily. 30 tablet 11  No facility-administered medications prior to visit.    No Known Allergies  Review of Systems  Constitutional:  Negative for chills, fever, malaise/fatigue and weight loss.  Eyes:  Negative for blurred vision and double vision.  Respiratory:  Negative for shortness of breath.   Cardiovascular:  Negative for chest pain and palpitations.  Gastrointestinal:  Negative for abdominal pain, constipation, diarrhea, nausea and vomiting.  Genitourinary:  Negative for dysuria, frequency and urgency.  Neurological:  Negative for dizziness and focal weakness.  Endo/Heme/Allergies:  Negative for polydipsia.       Objective:    Physical Exam Constitutional:      General: He is not in acute distress.    Appearance: He is not ill-appearing.  Eyes:     Extraocular Movements: Extraocular movements intact.     Conjunctiva/sclera: Conjunctivae normal.  Cardiovascular:     Rate and Rhythm: Normal rate.  Pulmonary:     Effort: Pulmonary effort is normal.  Musculoskeletal:     Cervical back: Normal range of motion and neck supple.  Skin:    General: Skin is warm and dry.  Neurological:     General: No focal deficit present.     Mental Status: He is alert and oriented to person, place, and time.     Motor: No weakness.     Gait: Gait normal.  Psychiatric:        Mood and Affect: Mood normal.        Behavior: Behavior normal.        Thought Content: Thought content normal.      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 Refuses PCOT A1c in office. Refuses to get labs done today.  Reports taking his medication as he " needs them" based on his blood sugars.  He is using a freestyle libre.  Reports taking Toujeo and Xigduo only on days his blood sugars are over 200 which averages out to around every 3- 7 days.  Reports having hypoglycemia some mornings.  Eats fast food for breakfast. Lives alone.  Advised him to hold off on Toujeo and start Xigduo daily. Monitor BS closely.  If BS increases (300) restart Toujeo 5 units daily.  He will follow up with Dr. Yetta Barre next month.   I am having Antonyo B. Michelle Piper maintain his diltiazem, Metoprolol Succinate, aspirin EC, cetirizine, triamcinolone, guaiFENesin, meclizine, Toujeo Max SoloStar, BD Pen Needle Micro U/F, Xigduo XR, promethazine, atorvastatin, losartan-hydrochlorothiazide, metoprolol succinate, metoprolol succinate, FreeStyle Libre 2 Reader, and [employer] 2 Sensor.  No orders of the defined types were placed in this encounter.

## 2023-05-27 NOTE — Patient Instructions (Addendum)
 Stop Toujeo for now. If you have a morning blood sugar less than 100, do not take Toujeo that day.   Take Xigdueo everyday   Monitor blood sugars fasting (first thing in the morning) and 2 hours after a meal.   If your blood sugars get higher than 300, take Toujeo 5 units daily.   See Dr. Yetta Barre as scheduled for your diabetes.

## 2023-05-28 ENCOUNTER — Ambulatory Visit: Payer: Medicare PPO | Admitting: Internal Medicine

## 2023-06-17 LAB — BASIC METABOLIC PANEL WITH GFR
BUN: 17 (ref 4–21)
Glucose: 139
Potassium: 3.6 meq/L (ref 3.5–5.1)

## 2023-06-17 LAB — MICROALBUMIN, URINE: Microalb, Ur: 0.7

## 2023-06-17 LAB — COMPREHENSIVE METABOLIC PANEL WITH GFR: eGFR: 63

## 2023-06-17 LAB — LIPID PANEL
HDL: 47 (ref 35–70)
LDL Cholesterol: 77

## 2023-06-17 LAB — HEMOGLOBIN A1C: Hemoglobin A1C: 8.9

## 2023-06-23 ENCOUNTER — Ambulatory Visit (INDEPENDENT_AMBULATORY_CARE_PROVIDER_SITE_OTHER): Payer: Medicare PPO

## 2023-06-23 VITALS — BP 132/70 | HR 68 | Ht 65.0 in | Wt 166.4 lb

## 2023-06-23 DIAGNOSIS — Z0000 Encounter for general adult medical examination without abnormal findings: Secondary | ICD-10-CM | POA: Diagnosis not present

## 2023-06-23 NOTE — Patient Instructions (Addendum)
 Mr. Joseph Walker , Thank you for taking time to come for your Medicare Wellness Visit. I appreciate your ongoing commitment to your health goals. Please review the following plan we discussed and let me know if I can assist you in the future.   Referrals/Orders/Follow-Ups/Clinician Recommendations: Aim for 30 minutes of exercise or brisk walking, 6-8 glasses of water, and 5 servings of fruits and vegetables each day.   This is a list of the screening recommended for you and due dates:  Health Maintenance  Topic Date Due   Eye exam for diabetics  Never done   Zoster (Shingles) Vaccine (1 of 2) Never done   Colon Cancer Screening  Never done   COVID-19 Vaccine (3 - Pfizer risk series) 08/02/2019   Hemoglobin A1C  01/15/2023   Yearly kidney health urinalysis for diabetes  05/07/2023   Yearly kidney function blood test for diabetes  07/16/2023   Pneumonia Vaccine (1 of 2 - PCV) 07/16/2023*   Complete foot exam   07/16/2023   Flu Shot  10/23/2023   Medicare Annual Wellness Visit  06/22/2024   Hepatitis C Screening  Completed   HPV Vaccine  Aged Out   DTaP/Tdap/Td vaccine  Discontinued  *Topic was postponed. The date shown is not the original due date.    Advanced directives: (Provided) Advance directive discussed with you today. I have provided a copy for you to complete at home and have notarized. Once this is complete, please bring a copy in to our office so we can scan it into your chart.   Next Medicare Annual Wellness Visit scheduled for next year: Yes

## 2023-06-23 NOTE — Progress Notes (Signed)
 Subjective:   Joseph Walker is a 73 y.o. who presents for a Medicare Wellness preventive visit.  Visit Complete: In person  Persons Participating in Visit: Patient.  AWV Questionnaire: No: Patient Medicare AWV questionnaire was not completed prior to this visit.  Cardiac Risk Factors include: advanced age (>40men, >31 women);diabetes mellitus;hypertension;dyslipidemia;male gender     Objective:    Today's Vitals   06/23/23 1438  BP: 132/70  Pulse: 68  SpO2: 97%  Weight: 166 lb 6.4 oz (75.5 kg)  Height: 5\' 5"  (1.651 m)   Body mass index is 27.69 kg/m.     06/23/2023    2:38 PM 10/28/2022    1:27 PM 06/10/2022    1:19 PM 11/11/2021    8:51 AM  Advanced Directives  Does Patient Have a Medical Advance Directive? No No No No  Would patient like information on creating a medical advance directive? No - Patient declined No - Patient declined  No - Patient declined    Current Medications (verified) Outpatient Encounter Medications as of 06/23/2023  Medication Sig   aspirin EC 81 MG tablet Take 1 tablet (81 mg total) by mouth daily.   atorvastatin (LIPITOR) 40 MG tablet TAKE 1 TABLET(40 MG) BY MOUTH DAILY   Continuous Glucose Receiver (FREESTYLE LIBRE 2 READER) DEVI 1 Act by Does not apply route daily.   Continuous Glucose Sensor (FREESTYLE LIBRE 2 SENSOR) MISC Use every 14 days.   diltiazem (CARDIZEM CD) 240 MG 24 hr capsule Take 240 mg by mouth daily.   guaiFENesin (MUCINEX) 600 MG 12 hr tablet Take 2 tablets (1,200 mg total) by mouth 2 (two) times daily as needed.   insulin glargine, 2 Unit Dial, (TOUJEO MAX SOLOSTAR) 300 UNIT/ML Solostar Pen Inject 5-10 Units into the skin daily.   Insulin Pen Needle (BD PEN NEEDLE MICRO U/F) 32G X 6 MM MISC USE DAILY   losartan-hydrochlorothiazide (HYZAAR) 100-25 MG tablet Take 1 tablet by mouth daily.   meclizine (ANTIVERT) 25 MG tablet Take 0.5 tablets (12.5 mg total) by mouth 3 (three) times daily as needed for dizziness.   metoprolol  succinate (TOPROL-XL) 25 MG 24 hr tablet Take 25 mg by mouth daily.   metoprolol succinate (TOPROL-XL) 50 MG 24 hr tablet Take 50 mg by mouth daily.   Metoprolol Succinate 25 MG CS24 Take by mouth.   promethazine (PHENERGAN) 12.5 MG tablet Take 1 tablet (12.5 mg total) by mouth every 6 (six) hours as needed for nausea or vomiting.   triamcinolone (NASACORT) 55 MCG/ACT AERO nasal inhaler Place 2 sprays into the nose daily.   XIGDUO XR 12-998 MG TB24 TAKE 1 TABLET BY MOUTH DAILY   cetirizine (ZYRTEC) 10 MG tablet Take 1 tablet (10 mg total) by mouth daily.   No facility-administered encounter medications on file as of 06/23/2023.    Allergies (verified) Patient has no known allergies.   History: Past Medical History:  Diagnosis Date   Diabetes mellitus without complication (HCC)    Glaucoma    Hyperlipemia    Hypertension    Tachycardia    Past Surgical History:  Procedure Laterality Date   TUMOR REMOVAL Left    Left Breast   Family History  Problem Relation Age of Onset   Glaucoma Mother    Cataracts Mother    Alzheimer's disease Mother    Lung disease Father    Alzheimer's disease Maternal Grandmother    Alzheimer's disease Maternal Grandfather    Social History   Socioeconomic History  Marital status: Single    Spouse name: Not on file   Number of children: Not on file   Years of education: Not on file   Highest education level: Not on file  Occupational History   Not on file  Tobacco Use   Smoking status: Never    Passive exposure: Never   Smokeless tobacco: Never  Vaping Use   Vaping status: Never Used  Substance and Sexual Activity   Alcohol use: Yes    Alcohol/week: 2.0 standard drinks of alcohol    Types: 2 Cans of beer per week    Comment: occ   Drug use: Never   Sexual activity: Yes    Partners: Female  Other Topics Concern   Not on file  Social History Narrative   Single   Social Drivers of Health   Financial Resource Strain: Low Risk   (06/23/2023)   Overall Financial Resource Strain (CARDIA)    Difficulty of Paying Living Expenses: Not hard at all  Food Insecurity: No Food Insecurity (06/23/2023)   Hunger Vital Sign    Worried About Running Out of Food in the Last Year: Never true    Ran Out of Food in the Last Year: Never true  Transportation Needs: No Transportation Needs (06/23/2023)   PRAPARE - Administrator, Civil Service (Medical): No    Lack of Transportation (Non-Medical): No  Physical Activity: Inactive (06/23/2023)   Exercise Vital Sign    Days of Exercise per Week: 0 days    Minutes of Exercise per Session: 0 min  Stress: No Stress Concern Present (06/23/2023)   [employer] of Occupational Health - Occupational Stress Questionnaire    Feeling of Stress : Not at all  Social Connections: Unknown (06/23/2023)   Social Connection and Isolation Panel [NHANES]    Frequency of Communication with Friends and Family: Once a week    Frequency of Social Gatherings with Friends and Family: Not on file    Attends Religious Services: Never    Database administrator or Organizations: No    Attends Engineer, structural: Never    Marital Status: Never married    Tobacco Counseling Counseling given: No    Clinical Intake:  Pre-visit preparation completed: Yes  Pain : No/denies pain     BMI - recorded: 27.69 Nutritional Status: BMI 25 -29 Overweight Nutritional Risks: None Diabetes: Yes CBG done?: No Did pt. bring in CBG monitor from home?: No  Lab Results  Component Value Date   HGBA1C 7.9 (H) 07/16/2022   HGBA1C 7.7 (H) 02/27/2022   HGBA1C 8.9 (H) 08/28/2021     How often do you need to have someone help you when you read instructions, pamphlets, or other written materials from your doctor or pharmacy?: 1 - Never  Interpreter Needed?: No  Information entered by :: Hassell Halim, CMA   Activities of Daily Living     06/23/2023    2:42 PM  In your present state of health, do  you have any difficulty performing the following activities:  Hearing? 0  Vision? 0  Difficulty concentrating or making decisions? 0  Walking or climbing stairs? 0  Dressing or bathing? 0  Doing errands, shopping? 0  Preparing Food and eating ? N  Using the Toilet? N  In the past six months, have you accidently leaked urine? N  Do you have problems with loss of bowel control? N  Managing your Medications? N  Managing your Finances? N  Housekeeping or managing your Housekeeping? N    Patient Care Team: Etta Grandchild, MD as PCP - General (Internal Medicine)  Indicate any recent Medical Services you may have received from other than [facility] providers in the past year (date may be approximate).     Assessment:   This is a routine wellness examination for Carmen.  Hearing/Vision screen Hearing Screening - Comments:: Denies hearing difficulties   Vision Screening - Comments:: Yes- difficulty with vision - appt w/Opthalmologist   Goals Addressed               This Visit's Progress     Patient Stated (pt-stated)        Patient stated he wants to get glucose controlled better.       Depression Screen     06/23/2023    2:45 PM 10/06/2022    2:01 PM 07/16/2022    9:11 AM 06/10/2022    1:11 PM 02/27/2022   10:30 AM 02/12/2022    1:05 PM 11/26/2021    2:42 PM  PHQ 2/9 Scores  PHQ - 2 Score 2 0 0 0 0    PHQ- 9 Score 4  0  0    Exception Documentation      Patient refusal Patient refusal    Fall Risk     06/23/2023    2:55 PM 10/06/2022    2:01 PM 06/10/2022    1:06 PM 11/26/2021    2:43 PM 08/28/2021   10:05 AM  Fall Risk   Falls in the past year? 0 0 0 0 0  Number falls in past yr: 0 0 0    Injury with Fall? 0 0 0 0   Risk for fall due to : No Fall Risks No Fall Risks No Fall Risks No Fall Risks   Follow up Falls prevention discussed;Falls evaluation completed Falls evaluation completed Education provided;Falls prevention discussed Falls evaluation completed     MEDICARE  RISK AT HOME:  Medicare Risk at Home Any stairs in or around the home?: Yes If so, are there any without handrails?: No Home free of loose throw rugs in walkways, pet beds, electrical cords, etc?: Yes Adequate lighting in your home to reduce risk of falls?: Yes Life alert?: No Use of a cane, walker or w/c?: No Grab bars in the bathroom?: Yes Shower chair or bench in shower?: No Elevated toilet seat or a handicapped toilet?: No  TIMED UP AND GO:  Was the test performed?  No  Cognitive Function: 6CIT completed        06/23/2023    2:55 PM 06/10/2022    1:23 PM  6CIT Screen  What Year? 0 points 0 points  What month? 0 points 0 points  What time? 0 points 0 points  Count back from 20 0 points 0 points  Months in reverse 0 points 4 points  Repeat phrase 0 points 4 points  Total Score 0 points 8 points    Immunizations Immunization History  Administered Date(s) Administered   PFIZER(Purple Top)SARS-COV-2 Vaccination 05/30/2019, 07/05/2019    Screening Tests Health Maintenance  Topic Date Due   OPHTHALMOLOGY EXAM  Never done   Zoster Vaccines- Shingrix (1 of 2) Never done   Colonoscopy  Never done   COVID-19 Vaccine (3 - Pfizer risk series) 08/02/2019   HEMOGLOBIN A1C  01/15/2023   Diabetic kidney evaluation - Urine ACR  05/07/2023   Diabetic kidney evaluation - eGFR measurement  07/16/2023   Pneumonia Vaccine 65+  Years old (1 of 2 - PCV) 07/16/2023 (Originally 12/10/1956)   FOOT EXAM  07/16/2023   INFLUENZA VACCINE  10/23/2023   Medicare Annual Wellness (AWV)  06/22/2024   Hepatitis C Screening  Completed   HPV VACCINES  Aged Out   DTaP/Tdap/Td  Discontinued    Health Maintenance  Health Maintenance Due  Topic Date Due   OPHTHALMOLOGY EXAM  Never done   Zoster Vaccines- Shingrix (1 of 2) Never done   Colonoscopy  Never done   COVID-19 Vaccine (3 - Pfizer risk series) 08/02/2019   HEMOGLOBIN A1C  01/15/2023   Diabetic kidney evaluation - Urine ACR  05/07/2023    Diabetic kidney evaluation - eGFR measurement  07/16/2023   Health Maintenance Items Addressed:06/23/2023  Ophthalmology status: Patient stated has an appt on 07/06/2023. Unable to recall name of new Ophthalmologist.  Additional Screening:  Vision Screening: Recommended annual ophthalmology exams for early detection of glaucoma and other disorders of the eye.  Dental Screening: Recommended annual dental exams for proper oral hygiene  Community Resource Referral / Chronic Care Management: CRR required this visit?  No   CCM required this visit?  No     Plan:     I have personally reviewed and noted the following in the patient's chart:   Medical and social history Use of alcohol, tobacco or illicit drugs  Current medications and supplements including opioid prescriptions. Patient is not currently taking opioid prescriptions. Functional ability and status Nutritional status Physical activity Advanced directives List of other physicians Hospitalizations, surgeries, and ER visits in previous 12 months Vitals Screenings to include cognitive, depression, and falls Referrals and appointments  In addition, I have reviewed and discussed with patient certain preventive protocols, quality metrics, and best practice recommendations. A written personalized care plan for preventive services as well as general preventive health recommendations were provided to patient.     Darreld Mclean, CMA   06/23/2023   After Visit Summary: (In Person-Printed) AVS printed and given to the patient  Notes: Nothing significant to report at this time.

## 2023-07-13 ENCOUNTER — Other Ambulatory Visit: Payer: Self-pay | Admitting: Internal Medicine

## 2023-07-13 DIAGNOSIS — E785 Hyperlipidemia, unspecified: Secondary | ICD-10-CM

## 2023-07-20 ENCOUNTER — Encounter: Payer: Self-pay | Admitting: Internal Medicine

## 2023-07-20 ENCOUNTER — Ambulatory Visit: Payer: Medicare PPO | Admitting: Internal Medicine

## 2023-07-20 VITALS — BP 150/90 | HR 61 | Temp 98.3°F | Ht 65.0 in | Wt 166.8 lb

## 2023-07-20 DIAGNOSIS — Z0000 Encounter for general adult medical examination without abnormal findings: Secondary | ICD-10-CM

## 2023-07-20 DIAGNOSIS — E139 Other specified diabetes mellitus without complications: Secondary | ICD-10-CM | POA: Diagnosis not present

## 2023-07-20 DIAGNOSIS — Z0001 Encounter for general adult medical examination with abnormal findings: Secondary | ICD-10-CM

## 2023-07-20 DIAGNOSIS — R9431 Abnormal electrocardiogram [ECG] [EKG]: Secondary | ICD-10-CM | POA: Diagnosis not present

## 2023-07-20 DIAGNOSIS — I10 Essential (primary) hypertension: Secondary | ICD-10-CM

## 2023-07-20 DIAGNOSIS — K2100 Gastro-esophageal reflux disease with esophagitis, without bleeding: Secondary | ICD-10-CM | POA: Diagnosis not present

## 2023-07-20 DIAGNOSIS — E785 Hyperlipidemia, unspecified: Secondary | ICD-10-CM | POA: Diagnosis not present

## 2023-07-20 DIAGNOSIS — Z23 Encounter for immunization: Secondary | ICD-10-CM | POA: Insufficient documentation

## 2023-07-20 DIAGNOSIS — N400 Enlarged prostate without lower urinary tract symptoms: Secondary | ICD-10-CM

## 2023-07-20 DIAGNOSIS — Z1211 Encounter for screening for malignant neoplasm of colon: Secondary | ICD-10-CM

## 2023-07-20 LAB — CBC WITH DIFFERENTIAL/PLATELET
Basophils Absolute: 0 10*3/uL (ref 0.0–0.1)
Basophils Relative: 0.1 % (ref 0.0–3.0)
Eosinophils Absolute: 0.3 10*3/uL (ref 0.0–0.7)
Eosinophils Relative: 6.4 % — ABNORMAL HIGH (ref 0.0–5.0)
HCT: 47.1 % (ref 39.0–52.0)
Hemoglobin: 15.4 g/dL (ref 13.0–17.0)
Lymphocytes Relative: 31.7 % (ref 12.0–46.0)
Lymphs Abs: 1.5 10*3/uL (ref 0.7–4.0)
MCHC: 32.8 g/dL (ref 30.0–36.0)
MCV: 80.3 fl (ref 78.0–100.0)
Monocytes Absolute: 0.6 10*3/uL (ref 0.1–1.0)
Monocytes Relative: 12 % (ref 3.0–12.0)
Neutro Abs: 2.4 10*3/uL (ref 1.4–7.7)
Neutrophils Relative %: 49.8 % (ref 43.0–77.0)
Platelets: 202 10*3/uL (ref 150.0–400.0)
RBC: 5.87 Mil/uL — ABNORMAL HIGH (ref 4.22–5.81)
RDW: 14.2 % (ref 11.5–15.5)
WBC: 4.8 10*3/uL (ref 4.0–10.5)

## 2023-07-20 LAB — HEPATIC FUNCTION PANEL
ALT: 18 U/L (ref 0–53)
AST: 22 U/L (ref 0–37)
Albumin: 4.4 g/dL (ref 3.5–5.2)
Alkaline Phosphatase: 59 U/L (ref 39–117)
Bilirubin, Direct: 0.1 mg/dL (ref 0.0–0.3)
Total Bilirubin: 0.8 mg/dL (ref 0.2–1.2)
Total Protein: 7.4 g/dL (ref 6.0–8.3)

## 2023-07-20 LAB — BASIC METABOLIC PANEL WITH GFR
BUN: 11 mg/dL (ref 6–23)
CO2: 26 meq/L (ref 19–32)
Calcium: 9.6 mg/dL (ref 8.4–10.5)
Chloride: 101 meq/L (ref 96–112)
Creatinine, Ser: 0.99 mg/dL (ref 0.40–1.50)
GFR: 76.59 mL/min (ref >60.00)
Glucose, Bld: 139 mg/dL — ABNORMAL HIGH (ref 70–99)
Potassium: 3.8 meq/L (ref 3.5–5.1)
Sodium: 137 meq/L (ref 135–145)

## 2023-07-20 LAB — TSH: TSH: 3.04 u[IU]/mL (ref 0.35–5.50)

## 2023-07-20 LAB — URINALYSIS, ROUTINE W REFLEX MICROSCOPIC
Bilirubin Urine: NEGATIVE
Hgb urine dipstick: NEGATIVE
Ketones, ur: NEGATIVE
Leukocytes,Ua: NEGATIVE
Nitrite: NEGATIVE
RBC / HPF: NONE SEEN (ref 0–?)
Specific Gravity, Urine: 1.02 (ref 1.000–1.030)
Total Protein, Urine: NEGATIVE
Urine Glucose: NEGATIVE
Urobilinogen, UA: 1 (ref 0.0–1.0)
WBC, UA: NONE SEEN (ref 0–?)
pH: 6 (ref 5.0–8.0)

## 2023-07-20 LAB — MICROALBUMIN / CREATININE URINE RATIO
Creatinine,U: 104.3 mg/dL
Microalb Creat Ratio: 9 mg/g (ref 0.0–30.0)
Microalb, Ur: 0.9 mg/dL (ref 0.0–1.9)

## 2023-07-20 LAB — BRAIN NATRIURETIC PEPTIDE: Pro B Natriuretic peptide (BNP): 5 pg/mL (ref 0.0–100.0)

## 2023-07-20 LAB — PSA: PSA: 3.53 ng/mL (ref 0.10–4.00)

## 2023-07-20 LAB — TROPONIN I (HIGH SENSITIVITY): High Sens Troponin I: 6 ng/L (ref 2–17)

## 2023-07-20 MED ORDER — SHINGRIX 50 MCG/0.5ML IM SUSR: 0.5000 mL | Freq: Once | INTRAMUSCULAR | 1 refills | Status: AC

## 2023-07-20 NOTE — Patient Instructions (Signed)
 Health Maintenance, Male  Adopting a healthy lifestyle and getting preventive care are important in promoting health and wellness. Ask your health care provider about:  The right schedule for you to have regular tests and exams.  Things you can do on your own to prevent diseases and keep yourself healthy.  What should I know about diet, weight, and exercise?  Eat a healthy diet    Eat a diet that includes plenty of vegetables, fruits, low-fat dairy products, and lean protein.  Do not eat a lot of foods that are high in solid fats, added sugars, or sodium.  Maintain a healthy weight  Body mass index (BMI) is a measurement that can be used to identify possible weight problems. It estimates body fat based on height and weight. Your health care provider can help determine your BMI and help you achieve or maintain a healthy weight.  Get regular exercise  Get regular exercise. This is one of the most important things you can do for your health. Most adults should:  Exercise for at least 150 minutes each week. The exercise should increase your heart rate and make you sweat (moderate-intensity exercise).  Do strengthening exercises at least twice a week. This is in addition to the moderate-intensity exercise.  Spend less time sitting. Even light physical activity can be beneficial.  Watch cholesterol and blood lipids  Have your blood tested for lipids and cholesterol at 72 years of age, then have this test every 5 years.  You may need to have your cholesterol levels checked more often if:  Your lipid or cholesterol levels are high.  You are older than 72 years of age.  You are at high risk for heart disease.  What should I know about cancer screening?  Many types of cancers can be detected early and may often be prevented. Depending on your health history and family history, you may need to have cancer screening at various ages. This may include screening for:  Colorectal cancer.  Prostate cancer.  Skin cancer.  Lung  cancer.  What should I know about heart disease, diabetes, and high blood pressure?  Blood pressure and heart disease  High blood pressure causes heart disease and increases the risk of stroke. This is more likely to develop in people who have high blood pressure readings or are overweight.  Talk with your health care provider about your target blood pressure readings.  Have your blood pressure checked:  Every 3-5 years if you are 9-95 years of age.  Every year if you are 85 years old or older.  If you are between the ages of 29 and 29 and are a current or former smoker, ask your health care provider if you should have a one-time screening for abdominal aortic aneurysm (AAA).  Diabetes  Have regular diabetes screenings. This checks your fasting blood sugar level. Have the screening done:  Once every three years after age 23 if you are at a normal weight and have a low risk for diabetes.  More often and at a younger age if you are overweight or have a high risk for diabetes.  What should I know about preventing infection?  Hepatitis B  If you have a higher risk for hepatitis B, you should be screened for this virus. Talk with your health care provider to find out if you are at risk for hepatitis B infection.  Hepatitis C  Blood testing is recommended for:  Everyone born from [birth year] through 1964.  Anyone  with known risk factors for hepatitis C.  Sexually transmitted infections (STIs)  You should be screened each year for STIs, including gonorrhea and chlamydia, if:  You are sexually active and are younger than 72 years of age.  You are older than 72 years of age and your health care provider tells you that you are at risk for this type of infection.  Your sexual activity has changed since you were last screened, and you are at increased risk for chlamydia or gonorrhea. Ask your health care provider if you are at risk.  Ask your health care provider about whether you are at high risk for HIV. Your health care provider  may recommend a prescription medicine to help prevent HIV infection. If you choose to take medicine to prevent HIV, you should first get tested for HIV. You should then be tested every 3 months for as long as you are taking the medicine.  Follow these instructions at home:  Alcohol use  Do not drink alcohol if your health care provider tells you not to drink.  If you drink alcohol:  Limit how much you have to 0-2 drinks a day.  Know how much alcohol is in your drink. In the [place] one drink equals one 12 oz bottle of beer (355 mL), one 5 oz glass of wine (148 mL), or one 1 oz glass of hard liquor (44 mL).  Lifestyle  Do not use any products that contain nicotine or tobacco. These products include cigarettes, chewing tobacco, and vaping devices, such as e-cigarettes. If you need help quitting, ask your health care provider.  Do not use street drugs.  Do not share needles.  Ask your health care provider for help if you need support or information about quitting drugs.  General instructions  Schedule regular health, dental, and eye exams.  Stay current with your vaccines.  Tell your health care provider if:  You often feel depressed.  You have ever been abused or do not feel safe at home.  Summary  Adopting a healthy lifestyle and getting preventive care are important in promoting health and wellness.  Follow your health care provider's instructions about healthy diet, exercising, and getting tested or screened for diseases.  Follow your health care provider's instructions on monitoring your cholesterol and blood pressure.  This information is not intended to replace advice given to you by your health care provider. Make sure you discuss any questions you have with your health care provider.  Document Revised: 07/30/2020 Document Reviewed: 07/30/2020  Elsevier Patient Education  2024 [employer].

## 2023-07-20 NOTE — Progress Notes (Unsigned)
 Subjective:  Patient ID: Joseph Walker, male    DOB: 10-09-1950  Age: 73 y.o. MRN: [id number]  CC: Annual Exam, Hypertension, Hyperlipidemia, and Diabetes   HPI [name] presents for a CPX and follow-up.....  Discussed the use of AI scribe software for clinical note transcription with the patient, who gave verbal consent to proceed.  History of Present Illness   Joseph Walker is a 73 year old male with diabetes who presents for follow-up regarding his medication change to Ozempic.  He is in his third week of taking Ozempic after being switched from Toujeo  by his endocrinologist. Since starting Ozempic, his blood sugar levels have been more stable, but he experienced a significant drop to 64 mg/dL at 4 AM today, which he managed with Pepsi and breakfast. He notes difficulty in raising his blood sugar levels after such drops. Previously, while on Toujeo , he experienced similar issues with blood sugar dropping during sleep. His last A1c was 8.9%.  No chest pain, shortness of breath, dizziness, or lightheadedness. His blood pressure was high at 150/90 today, which he attributes to not taking his medication due to being rushed. No headache or blurred vision, although he notes his vision is 'a little off'.  He reports improvement in symptoms such as numbness in his feet since starting Ozempic, which were present while on Toujeo . He remains active and feels fine during physical activity, but is concerned about blood sugar drops during sleep.       Outpatient Medications Prior to Visit  Medication Sig Dispense Refill   aspirin  EC 81 MG tablet Take 1 tablet (81 mg total) by mouth daily. 90 tablet 1   cetirizine  (ZYRTEC ) 10 MG tablet Take 1 tablet (10 mg total) by mouth daily. 30 tablet 11   Continuous Glucose Receiver (FREESTYLE LIBRE 2 READER) DEVI 1 Act by Does not apply route daily. 2 each 5   Continuous Glucose Sensor (FREESTYLE LIBRE 2 SENSOR) MISC Use every 14 days. 2 each 5    guaiFENesin  (MUCINEX ) 600 MG 12 hr tablet Take 2 tablets (1,200 mg total) by mouth 2 (two) times daily as needed. 60 tablet 1   insulin  glargine, 2 Unit Dial, (TOUJEO  MAX SOLOSTAR) 300 UNIT/ML Solostar Pen Inject 5-10 Units into the skin daily. 1 mL 0   Insulin  Pen Needle (BD PEN NEEDLE MICRO U/F) 32G X 6 MM MISC USE DAILY 100 each 1   losartan-hydrochlorothiazide (HYZAAR) 100-25 MG tablet Take 1 tablet by mouth daily.     metoprolol succinate (TOPROL-XL) 25 MG 24 hr tablet Take 25 mg by mouth daily.     XIGDUO  XR 12-998 MG TB24 TAKE 1 TABLET BY MOUTH DAILY 90 tablet 0   atorvastatin  (LIPITOR) 40 MG tablet TAKE 1 TABLET(40 MG) BY MOUTH DAILY 90 tablet 1   diltiazem (CARDIZEM CD) 240 MG 24 hr capsule Take 240 mg by mouth daily.     meclizine  (ANTIVERT ) 25 MG tablet Take 0.5 tablets (12.5 mg total) by mouth 3 (three) times daily as needed for dizziness. 60 tablet 1   metoprolol succinate (TOPROL-XL) 50 MG 24 hr tablet Take 50 mg by mouth daily.     Metoprolol Succinate 25 MG CS24 Take by mouth.     promethazine  (PHENERGAN ) 12.5 MG tablet Take 1 tablet (12.5 mg total) by mouth every 6 (six) hours as needed for nausea or vomiting. 30 tablet 0   triamcinolone  (NASACORT ) 55 MCG/ACT AERO nasal inhaler Place 2 sprays into the nose daily. 1  each 12   No facility-administered medications prior to visit.    ROS Review of Systems  Objective:  BP (!) 150/90 (BP Location: Left Arm, Patient Position: Sitting, Cuff Size: Normal)   Pulse 61   Temp 98.3 F (36.8 C) (Temporal)   Ht 5\' 5"  (1.651 m)   Wt 166 lb 12.8 oz (75.7 kg)   SpO2 96%   BMI 27.76 kg/m   BP Readings from Last 3 Encounters:  07/20/23 (!) 150/90  06/23/23 132/70  05/27/23 (!) 132/90    Wt Readings from Last 3 Encounters:  07/20/23 166 lb 12.8 oz (75.7 kg)  06/23/23 166 lb 6.4 oz (75.5 kg)  05/27/23 166 lb (75.3 kg)    Physical Exam Vitals reviewed.  Constitutional:      Appearance: Normal appearance.  HENT:      Mouth/Throat:     Mouth: Mucous membranes are moist.  Eyes:     General: No scleral icterus.    Conjunctiva/sclera: Conjunctivae normal.  Cardiovascular:     Rate and Rhythm: Normal rate and regular rhythm.     Heart sounds: No murmur heard.    No friction rub. No gallop.     Comments: EKG--- NSR, 63 bpm LAD - new ST elevation in V2 V3 No Q waves Pulmonary:     Effort: Pulmonary effort is normal.     Breath sounds: No stridor. Examination of the right-lower field reveals rales. Rales present. No decreased breath sounds, wheezing or rhonchi.  Abdominal:     General: Abdomen is flat.     Palpations: There is no mass.     Tenderness: There is no abdominal tenderness. There is no guarding.     Hernia: No hernia is present. There is no hernia in the left inguinal area or right inguinal area.  Genitourinary:    Pubic Area: No rash.      Penis: Normal and uncircumcised. No phimosis, paraphimosis, hypospadias, erythema, tenderness, discharge, swelling or lesions.      Testes: Normal.     Epididymis:     Right: Normal.     Left: Normal.     Comments: DRE - deferred at his request Musculoskeletal:        General: Normal range of motion.     Cervical back: Neck supple.     Right lower leg: No edema.     Left lower leg: No edema.  Lymphadenopathy:     Cervical: No cervical adenopathy.     Lower Body: No right inguinal adenopathy. No left inguinal adenopathy.  Skin:    General: Skin is warm and dry.  Neurological:     General: No focal deficit present.     Mental Status: He is alert. Mental status is at baseline.     Lab Results  Component Value Date   WBC 4.8 07/20/2023   HGB 15.4 07/20/2023   HCT 47.1 07/20/2023   PLT 202.0 07/20/2023   GLUCOSE 139 (H) 07/20/2023   CHOL 130 05/06/2022   TRIG 222.0 (H) 05/06/2022   HDL 47 06/17/2023   LDLDIRECT 63.0 05/06/2022   LDLCALC 77 06/17/2023   ALT 18 07/20/2023   AST 22 07/20/2023   NA 137 07/20/2023   K 3.8 07/20/2023   CL  101 07/20/2023   CREATININE 0.99 07/20/2023   BUN 11 07/20/2023   CO2 26 07/20/2023   TSH 3.04 07/20/2023   PSA 3.53 07/20/2023   HGBA1C 8.9 06/17/2023   MICROALBUR 0.9 07/20/2023    VAS US   AAA DUPLEX Result Date: 12/22/2022 ABDOMINAL AORTA STUDY Patient Name:  JERYN GALLIGHER  Date of Exam:   12/22/2022 Medical Rec #: [id number]      Accession #:    [id number] Date of Birth: 01-Jan-1951      Patient Gender: M Patient Age:   31 years Exam Location:  [facility] Procedure:      VAS US  AAA DUPLEX Referring Phys: Sandra Crouch --------------------------------------------------------------------------------  Indications: Primary hypertension, Abdominal bruit Risk Factors: Hypertension, hyperlipidemia, Diabetes. Limitations: Air/bowel gas.  Performing Technologist: Aloma Arrant RVS  Examination Guidelines: A complete evaluation includes B-mode imaging, spectral Doppler, color Doppler, and power Doppler as needed of all accessible portions of each vessel. Bilateral testing is considered an integral part of a complete examination. Limited examinations for reoccurring indications may be performed as noted.  Abdominal Aorta Findings: +-----------+-------+----------+----------+--------+--------+--------+ Location   AP (cm)Trans (cm)PSV (cm/s)WaveformThrombusComments +-----------+-------+----------+----------+--------+--------+--------+ Proximal   2.36   2.81      82                                 +-----------+-------+----------+----------+--------+--------+--------+ Mid        1.52   1.91      91                                 +-----------+-------+----------+----------+--------+--------+--------+ Distal     2.12   2.33      82                                 +-----------+-------+----------+----------+--------+--------+--------+ RT CIA Prox0.9    1.0       80                                 +-----------+-------+----------+----------+--------+--------+--------+  LT CIA Prox0.9    0.9       71                                 +-----------+-------+----------+----------+--------+--------+--------+  Summary: Abdominal Aorta: No evidence of an abdominal aortic aneurysm was visualized.  *See table(s) above for measurements and observations.  Electronically signed by Genny Kid MD on 12/22/2022 at 3:12:27 PM.    Final     Assessment & Plan:  Diabetes 1.5, managed as type 1 (HCC) -     Ambulatory referral to Ophthalmology -     HM Diabetes Foot Exam -     Basic metabolic panel with GFR; Future -     Microalbumin / creatinine urine ratio; Future -     Urinalysis, Routine w reflex microscopic; Future -     AMB Referral VBCI Care Management  Benign prostatic hyperplasia without lower urinary tract symptoms -     PSA; Future  Primary hypertension -     TSH; Future -     EKG 12-Lead -     Basic metabolic panel with GFR; Future -     CBC with Differential/Platelet; Future -     Hepatic function panel; Future -     Urinalysis, Routine w reflex microscopic; Future -     amLODIPine Besylate; Take 1 tablet (5 mg total) by mouth daily.  Dispense: 90  tablet; Refill: 0 -     AMB Referral VBCI Care Management  Colon cancer screening -     Ambulatory referral to Gastroenterology  Gastroesophageal reflux disease with esophagitis without hemorrhage  Hyperlipidemia LDL goal <70 -     TSH; Future -     Hepatic function panel; Future -     Hepatic function panel; Future -     Atorvastatin  Calcium ; Take 1 tablet (40 mg total) by mouth daily.  Dispense: 90 tablet; Refill: 1  Need for prophylactic vaccination and inoculation against varicella -     Shingrix; Inject 0.5 mLs into the muscle once for 1 dose.  Dispense: 0.5 mL; Refill: 1  Abnormal EKG -     Troponin I (High Sensitivity); Future -     Brain natriuretic peptide; Future -     Ambulatory referral to Cardiology     Follow-up: Return in about 3 months (around 10/19/2023).  Sandra Crouch, MD

## 2023-07-21 ENCOUNTER — Encounter: Payer: Self-pay | Admitting: Internal Medicine

## 2023-07-21 MED ORDER — ATORVASTATIN CALCIUM 40 MG PO TABS: 40.0000 mg | ORAL_TABLET | Freq: Every day | ORAL | 1 refills | Status: AC

## 2023-07-21 MED ORDER — AMLODIPINE BESYLATE 5 MG PO TABS: 5.0000 mg | ORAL_TABLET | Freq: Every day | ORAL | 0 refills | Status: AC

## 2023-07-22 ENCOUNTER — Ambulatory Visit: Payer: Self-pay

## 2023-07-22 DIAGNOSIS — Z0001 Encounter for general adult medical examination with abnormal findings: Secondary | ICD-10-CM | POA: Insufficient documentation

## 2023-07-22 NOTE — Assessment & Plan Note (Signed)
 Exam completed, labs reviewed, vaccines reviewed, cancer screenings addressed, pt ed material was given.

## 2023-07-22 NOTE — Telephone Encounter (Signed)
 Chief Complaint: Sample Question  Disposition: [] ED /[] Urgent Care (no appt availability in office) / [] Appointment(In office/virtual)/ []  [facility]/ [] Home Care/ [] Refused Recommended Disposition /[] [facility]/ [x]  Follow-up with PCP Additional Notes: Patient has received 3 defective glucometer and pharmacy will not take them back. Patient states he spent all of his money on them and can afford to buy another. Patient asking if sample meter is available at the office. Patient denies any symptoms. Transferred to CAL.   Copied from CRM [id number]. Topic: Clinical - Red Word Triage >> Jul 22, 2023 12:03 PM Dewanda Foots wrote: Red Word that prompted transfer to Nurse Triage: Blood sugar. Pt states he has gone to and from the pharmacy 3X and gotten a defective blood glucose monitoring system each time. Took them back (pharmacy will not take them) but they did verify that they were all defective. Pt. States he spent all his money on them and now has no way of checking his blood sugar. Wants to know if there is a way to get a sample machine or some way of checking his blood sugar. Reason for Disposition  [1] Caller requesting NON-URGENT health information AND [2] PCP's office is the best resource  Answer Assessment - Initial Assessment Questions 1. REASON FOR CALL or QUESTION: "What is your reason for calling today?" or "How can I best help you?" or "What question do you have that I can help answer?"     Needs a glucometer  Protocols used: Information Only Call - No Triage-A-AH

## 2023-07-22 NOTE — Telephone Encounter (Signed)
 Copied from CRM [id number]. Topic: Clinical - Medical Advice >> Jul 22, 2023 12:16 PM Dewanda Foots wrote: Reason for CRM: Red Word that prompted transfer to Nurse Triage: Blood sugar. Pt states he has gone to and from the pharmacy 3X and gotten a defective blood glucose monitoring system each time. Took them back (pharmacy will not take them) but they did verify that theywere all defective. Pt. States he spent all his money on them and now has no way of checking his blood sugar. Wants to know if there is a way to get a sample machine or some way of checking his blood sugar.

## 2023-07-24 NOTE — Telephone Encounter (Signed)
 Patient was seen in office

## 2023-07-28 ENCOUNTER — Telehealth: Payer: Self-pay | Admitting: *Deleted

## 2023-07-28 NOTE — Progress Notes (Signed)
 Care Guide Pharmacy Note  07/28/2023 Name: Joseph Walker MRN: [id number] DOB: 10-Jul-1950  Referred By: Arcadio Knuckles, MD Reason for referral: Complex Care Management and Call Attempt #1 (Outreach to schedule referral with pharmacist )   Joseph Walker is a 73 y.o. year old male who is a primary care patient of Arcadio Knuckles, MD.  Joseph Walker was referred to the pharmacist for assistance related to: DMII  An unsuccessful telephone outreach was attempted today to contact the patient who was referred to the pharmacy team for assistance with medication management. Additional attempts will be made to contact the patient.  Joseph Walker, CMA [place]  [facility], [facility] Guide Direct Dial: [phone]  Fax: [phone] Website: [email or web address]

## 2023-07-30 ENCOUNTER — Telehealth: Payer: Self-pay | Admitting: Internal Medicine

## 2023-07-30 NOTE — Telephone Encounter (Signed)
 I was able to speak with the pt and discuss the newest medications that was rx'd by Dr. Rochelle Chu. Pt has stated understanding and has no question's or concerns at this time.

## 2023-07-30 NOTE — Progress Notes (Signed)
 Care Guide Pharmacy Note  07/30/2023 Name: Joseph Walker MRN: [id number] DOB: 04/10/1950  Referred By: Arcadio Knuckles, MD Reason for referral: Complex Care Management and Call Attempt #1 (Outreach to schedule referral with pharmacist )   Joseph Walker is a 73 y.o. year old male who is a primary care patient of Arcadio Knuckles, MD.  Charla Condon was referred to the pharmacist for assistance related to: DMII  A second unsuccessful telephone outreach was attempted today to contact the patient who was referred to the pharmacy team for assistance with medication management. Additional attempts will be made to contact the patient.  Kandis Ormond, CMA [place]  [facility], [facility] Guide Direct Dial: [phone]  Fax: [phone] Website: [email or web address]

## 2023-07-30 NOTE — Telephone Encounter (Signed)
 Copied from CRM [id number]. Topic: Clinical - Medication Question >> Jul 30, 2023 10:41 AM Allyne Areola wrote: Reason for CRM: Patient went to pick up metoprolol succinate (TOPROL-XL) 25 MG 24 hr tablet at his local pharmacy and they advised Dr.Jones sent an additional prescription but patient does not know what the medication is, I asked if he had the name but he said he did not take it. He would like to know if Dr.Jones wanted him to start a new medication or what the prescription is for.

## 2023-07-31 NOTE — Progress Notes (Signed)
 Care Guide Pharmacy Note  07/31/2023 Name: TIBOR ZAKOWSKI MRN: [id number] DOB: Sep 21, 1950  Referred By: Arcadio Knuckles, MD Reason for referral: Complex Care Management and Call Attempt #1 (Outreach to schedule referral with pharmacist )   TILMON BOSARGE is a 72 y.o. year old male who is a primary care patient of Arcadio Knuckles, MD.  Charla Condon was referred to the pharmacist for assistance related to: DMII  A third unsuccessful telephone outreach was attempted today to contact the patient who was referred to the pharmacy team for assistance with medication management. The Population Health team is pleased to engage with this patient at any time in the future upon receipt of referral and should he/she be interested in assistance from the Population Health team.  Kandis Ormond, CMA, [facility]  [facility], [facility] Guide Direct Dial: [phone]  Fax: [phone] Website: [email or web address]

## 2023-08-12 ENCOUNTER — Encounter: Payer: Self-pay | Admitting: Internal Medicine

## 2023-09-17 DIAGNOSIS — E1165 Type 2 diabetes mellitus with hyperglycemia: Secondary | ICD-10-CM | POA: Diagnosis not present

## 2023-09-17 DIAGNOSIS — E559 Vitamin D deficiency, unspecified: Secondary | ICD-10-CM | POA: Diagnosis not present

## 2023-09-17 DIAGNOSIS — N1831 Chronic kidney disease, stage 3a: Secondary | ICD-10-CM | POA: Diagnosis not present

## 2023-09-17 DIAGNOSIS — E782 Mixed hyperlipidemia: Secondary | ICD-10-CM | POA: Diagnosis not present

## 2023-09-17 DIAGNOSIS — I10 Essential (primary) hypertension: Secondary | ICD-10-CM | POA: Diagnosis not present

## 2023-09-21 DIAGNOSIS — E1165 Type 2 diabetes mellitus with hyperglycemia: Secondary | ICD-10-CM | POA: Diagnosis not present

## 2023-10-23 ENCOUNTER — Telehealth: Payer: Self-pay

## 2023-10-23 NOTE — Telephone Encounter (Signed)
 Patient was identified as falling into the [place] Measure - Diabetes.   Patient was: Referred to Diabetes Management.

## 2023-11-05 ENCOUNTER — Ambulatory Visit: Admitting: Emergency Medicine

## 2023-12-17 DIAGNOSIS — I10 Essential (primary) hypertension: Secondary | ICD-10-CM | POA: Diagnosis not present

## 2023-12-17 DIAGNOSIS — E1165 Type 2 diabetes mellitus with hyperglycemia: Secondary | ICD-10-CM | POA: Diagnosis not present

## 2023-12-17 DIAGNOSIS — E782 Mixed hyperlipidemia: Secondary | ICD-10-CM | POA: Diagnosis not present

## 2023-12-17 DIAGNOSIS — N1831 Chronic kidney disease, stage 3a: Secondary | ICD-10-CM | POA: Diagnosis not present

## 2023-12-17 DIAGNOSIS — E559 Vitamin D deficiency, unspecified: Secondary | ICD-10-CM | POA: Diagnosis not present

## 2023-12-22 DIAGNOSIS — E782 Mixed hyperlipidemia: Secondary | ICD-10-CM | POA: Diagnosis not present

## 2023-12-22 DIAGNOSIS — E1165 Type 2 diabetes mellitus with hyperglycemia: Secondary | ICD-10-CM | POA: Diagnosis not present

## 2023-12-22 DIAGNOSIS — E559 Vitamin D deficiency, unspecified: Secondary | ICD-10-CM | POA: Diagnosis not present

## 2023-12-22 DIAGNOSIS — N1831 Chronic kidney disease, stage 3a: Secondary | ICD-10-CM | POA: Diagnosis not present

## 2024-02-17 ENCOUNTER — Encounter: Payer: Self-pay | Admitting: Pharmacist

## 2024-02-17 NOTE — Progress Notes (Signed)
 Pharmacy Quality Measure Review  This patient is appearing on a report for being at risk of failing the adherence measure for cholesterol (statin) and hypertension (ACEi/ARB) medications this calendar year.   Medication: Atorvastatin  Last fill date: 01/21/24 for 90 day supply  Medication: Losartan/HCTZ Last fill date: 02/16/24 for 30 day supply  Insurance report was not up to date. No action needed at this time.   Darrelyn Drum, PharmD, BCPS, CPP Clinical Pharmacist Practitioner [facility] at [facility] [phone]

## 2024-02-24 ENCOUNTER — Telehealth: Payer: Self-pay

## 2024-02-24 NOTE — Telephone Encounter (Signed)
 Patient dues for A1C per Thn for failing DM Metric. He refuses to come in for finger stick or blood draw.

## 2024-02-29 ENCOUNTER — Telehealth: Payer: Self-pay

## 2024-02-29 ENCOUNTER — Other Ambulatory Visit (HOSPITAL_COMMUNITY): Payer: Self-pay

## 2024-02-29 NOTE — Telephone Encounter (Signed)
 Pharmacy Patient Advocate Encounter   Received notification from CoverMyMeds that prior authorization for [facility] 2 sensor is required/requested.   Insurance verification completed.   The patient is insured through [employer].   Per chart notes, the last office visit was 07/30/23. Should the prior authorization be submitted for the patient and is the patient still under Dr. Joshua care?   Please advise  Key: [id number]

## 2024-02-29 NOTE — Telephone Encounter (Signed)
 Are you okay with this considering you haven't seen the patient since April

## 2024-03-01 NOTE — Telephone Encounter (Signed)
 You can do the PA to see if it will be approved or denied. But the patient hasn't seen us  but he has an upcoming appointment

## 2024-03-01 NOTE — Telephone Encounter (Signed)
He is due for an appt.

## 2024-03-03 ENCOUNTER — Other Ambulatory Visit (HOSPITAL_COMMUNITY): Payer: Self-pay

## 2024-03-03 NOTE — Telephone Encounter (Signed)
 Pharmacy Patient Advocate Encounter  Received notification from [employer] that Prior Authorization for [facility] 2 Plus sensor has been CANCELLED due to authorization already on file for this request

## 2024-03-03 NOTE — Telephone Encounter (Signed)
 Pharmacy Patient Advocate Encounter   Received notification from Pt Calls Messages that prior authorization for Freestyle Libre 2 Plus sensor is required/requested.   Insurance verification completed.   The patient is insured through [employer].   Per test claim: PA required; PA submitted to above mentioned insurance via Latent Key/confirmation #/EOC [id number] Status is pending

## 2024-03-04 NOTE — Telephone Encounter (Signed)
 Noted

## 2024-03-07 ENCOUNTER — Telehealth: Payer: Self-pay

## 2024-03-07 ENCOUNTER — Other Ambulatory Visit (HOSPITAL_COMMUNITY): Payer: Self-pay

## 2024-03-07 NOTE — Telephone Encounter (Signed)
 Pharmacy Patient Advocate Encounter   Received notification from Onbase that prior authorization for Freestyle libre 3 plus is required/requested.   Unable to verify insurance    Please have patient update insurance information

## 2024-03-09 ENCOUNTER — Telehealth: Payer: Self-pay

## 2024-03-09 NOTE — Telephone Encounter (Signed)
 Patient was identified as falling into the [place] Measure - Diabetes.   Patient was: Appointment already scheduled for:  12/30 with Diabetic management.

## 2024-03-23 ENCOUNTER — Ambulatory Visit: Payer: Self-pay

## 2024-03-23 NOTE — Telephone Encounter (Signed)
 FYI Only or Action Required?: FYI only for provider: homecare.  Patient was last seen in primary care on 07/20/2023 by Joshua Debby CROME, MD.  Called Nurse Triage reporting Cough.  Symptoms began several days ago.  Interventions attempted: OTC medications: mcuinex.  Symptoms are: stable.  Triage Disposition: Home Care  Patient/caregiver understands and will follow disposition?:    Reason for Disposition  Cough with cold symptoms (e.g., runny nose, postnasal drip, throat clearing)  Answer Assessment - Initial Assessment Questions Loss taste. Taking mucinex   1. ONSET: When did the cough begin?      3 days 3. SPUTUM: Describe the color of your sputum (e.g., none, dry cough; clear, white, yellow, green)     Non productive 4. HEMOPTYSIS: Are you coughing up any blood? If Yes, ask: How much? (e.g., flecks, streaks, tablespoons, etc.)     Denies 5. DIFFICULTY BREATHING: Are you having difficulty breathing? If Yes, ask: How bad is it? (e.g., mild, moderate, severe)      Denies 6. FEVER: Do you have a fever? If Yes, ask: What is your temperature, how was it measured, and when did it start?     Unsure 7. CARDIAC HISTORY: Do you have any history of heart disease? (e.g., heart attack, congestive heart failure)      arrhythmia 8. LUNG HISTORY: Do you have any history of lung disease?  (e.g., pulmonary embolus, asthma, emphysema)     Denies 9. PE RISK FACTORS: Do you have a history of blood clots? (or: recent major surgery, recent prolonged travel, bedridden)     Denies 10. OTHER SYMPTOMS: Do you have any other symptoms? (e.g., runny nose, wheezing, chest pain)       Runny nose 12. TRAVEL: Have you traveled out of the country in the last month? (e.g., travel history, exposures)       Denies  Protocols used: Cough - Acute Non-Productive-A-AH

## 2024-03-29 ENCOUNTER — Ambulatory Visit: Payer: Self-pay

## 2024-03-29 NOTE — Telephone Encounter (Signed)
 FYI Only or Action Required?: FYI only for provider: appointment scheduled on 1.7.26.  Patient was last seen in primary care on 07/20/2023 by Joshua Debby CROME, MD.  Called Nurse Triage reporting Dizziness.  Symptoms began several weeks ago.  Interventions attempted: Prescription medications: meclizine .  Symptoms are: gradually worsening.  Triage Disposition: See PCP When Office is Open (Within 3 Days)  Patient/caregiver understands and will follow disposition?: Yes

## 2024-03-29 NOTE — Telephone Encounter (Signed)
" ° ° ° ° ° ° °  Reason for Triage: Patient has known vertigo attacks had  one yesterday but felt fine after he took medication- denied any current issues but feels they are getting more frequent. Wants referral to PT again because that helped him. He has not seen PT since 11/2022 and per CRM template he needs an appt.   Joseph [phone]    Reason for Disposition  [1] MODERATE dizziness (e.g., vertigo; feels very unsteady, interferes with normal activities) AND [2] has been evaluated by doctor (or NP/PA) for this  Answer Assessment - Initial Assessment Questions Hx of vertigo. Symptoms began again about 2 weeks ago after having a bad cough/chest cold. He states they are starting to happen more frequently. He did have an episode initially while on the phone with RN when bending over. After he righted himself, all symptoms dissipated. He states meclizine  helps but he would like to go back to physical therapy bc that made it go away for a long time until he got sick recently. He denies any higher acuity symptoms.    1. DESCRIPTION: Describe your dizziness.     Room spinning and feels like he could faint 2. VERTIGO: Do you feel like either you or the room is spinning or tilting?      yes 3. LIGHTHEADED: Do you feel lightheaded? (e.g., somewhat faint, woozy, weak upon standing)     yes 4. SEVERITY: How bad is it?  Can you walk?     States when it happens, he feels like he can't well. Has to take medicine and then sit down.  5. ONSET:  When did the dizziness begin?     Ongoing but went away for a while. Came back about 2 weeks ago 6. AGGRAVATING FACTORS: Does anything make it worse? (e.g., standing, change in head position)     Bending over 7. CAUSE: What do you think is causing the dizziness?     Unknown 8. RECURRENT SYMPTOM: Have you had dizziness before? If Yes, ask: When was the last time? What happened that time?     Yes, meclizine  and PT 9. OTHER SYMPTOMS: Do you  have any other symptoms? (e.g., earache, headache, numbness, tinnitus, vomiting, weakness)     denies  Protocols used: Dizziness - Vertigo-A-AH  "

## 2024-03-30 ENCOUNTER — Encounter: Payer: Self-pay | Admitting: Internal Medicine

## 2024-03-30 ENCOUNTER — Ambulatory Visit: Admitting: Internal Medicine

## 2024-03-30 VITALS — BP 132/80 | HR 66 | Temp 98.9°F | Ht 65.0 in | Wt 168.0 lb

## 2024-03-30 DIAGNOSIS — I10 Essential (primary) hypertension: Secondary | ICD-10-CM

## 2024-03-30 DIAGNOSIS — N400 Enlarged prostate without lower urinary tract symptoms: Secondary | ICD-10-CM | POA: Insufficient documentation

## 2024-03-30 DIAGNOSIS — H6692 Otitis media, unspecified, left ear: Secondary | ICD-10-CM | POA: Diagnosis not present

## 2024-03-30 DIAGNOSIS — R42 Dizziness and giddiness: Secondary | ICD-10-CM | POA: Diagnosis not present

## 2024-03-30 MED ORDER — DOXYCYCLINE HYCLATE 100 MG PO TABS: 100.0000 mg | ORAL_TABLET | Freq: Two times a day (BID) | ORAL | 0 refills | Status: AC

## 2024-03-30 MED ORDER — MECLIZINE HCL 12.5 MG PO TABS: 12.5000 mg | ORAL_TABLET | Freq: Three times a day (TID) | ORAL | 1 refills | Status: DC | PRN

## 2024-03-30 NOTE — Assessment & Plan Note (Signed)
 C/w peripheral nerve left ear related likely - for meclizine  12.5 mg prn

## 2024-03-30 NOTE — Assessment & Plan Note (Signed)
 Mild to mod, for antibx course doxycyline 100 bid course, to f/u any worsening symptoms or concerns

## 2024-03-30 NOTE — Patient Instructions (Signed)
 Please take all new medication as prescribed - the antibiotic, and meclizine  as needed  Please continue all other medications as before, and refills have been done if requested.  Please have the pharmacy call with any other refills you may need.  Please keep your appointments with your specialists as you may have planned

## 2024-03-30 NOTE — Assessment & Plan Note (Signed)
 BP Readings from Last 3 Encounters:  03/30/24 132/80  07/20/23 (!) 150/90  06/23/23 132/70   Stable, pt to continue medical treatment norvasc  5 every day, hyzaar 100 25 mg every day, toprol xl 25 qd

## 2024-03-30 NOTE — Progress Notes (Signed)
 Patient ID: Joseph Walker, male   DOB: 1950-07-29, 74 y.o.   MRN: [id number]        Chief Complaint: follow up left otitis media, vertigo, htn       HPI:  Joseph Walker is a 74 y.o. male here with c/o left ear pressure and popping with discomfort for several days, also with vertigo with head position changes, mod to severe.   Had some leftover meclizine  which helps.  Pt denies chest pain, increased sob or doe, wheezing, orthopnea, PND, increased LE swelling, palpitations, dizziness or syncope.   Pt denies polydipsia, polyuria, or new focal neuro s/s.    Pt denies fever, wt loss, night sweats, loss of appetite, or other constitutional symptoms       Wt Readings from Last 3 Encounters:  03/30/24 168 lb (76.2 kg)  07/20/23 166 lb 12.8 oz (75.7 kg)  06/23/23 166 lb 6.4 oz (75.5 kg)   BP Readings from Last 3 Encounters:  03/30/24 132/80  07/20/23 (!) 150/90  06/23/23 132/70         Past Medical History:  Diagnosis Date   Diabetes mellitus without complication (HCC)    Glaucoma    Hyperlipemia    Hypertension    Tachycardia    Past Surgical History:  Procedure Laterality Date   TUMOR REMOVAL Left    Left Breast    reports that he has never smoked. He has never been exposed to tobacco smoke. He has never used smokeless tobacco. He reports current alcohol use of about 2.0 standard drinks of alcohol per week. He reports that he does not use drugs. family history includes Alzheimer's disease in his maternal grandfather, maternal grandmother, and mother; Cataracts in his mother; Glaucoma in his mother; Lung disease in his father. Allergies[1] Medications Ordered Prior to Encounter[2]      ROS:  All others reviewed and negative.  Objective        PE:  BP 132/80 (BP Location: Right Arm, Patient Position: Sitting, Cuff Size: Normal)   Pulse 66   Temp 98.9 F (37.2 C) (Oral)   Ht 5' 5 (1.651 m)   Wt 168 lb (76.2 kg)   SpO2 99%   BMI 27.96 kg/m                 Constitutional: Pt  appears mild ill               HENT: Head: NCAT.                Right Ear: External ear normal.                 Left Ear: External ear normal. Left tm's with mild erythema.  Max sinus areas mild tender.  Pharynx with mild erythema, no exudate                 Eyes: . Pupils are equal, round, and reactive to light. Conjunctivae and EOM are normal               Nose: without d/c or deformity               Neck: Neck supple. Gross normal ROM               Cardiovascular: Normal rate and regular rhythm.                 Pulmonary/Chest: Effort normal and breath sounds without rales or wheezing.  Neurological: Pt is alert. At baseline orientation, motor grossly intact               Skin: Skin is warm. No rashes, no other new lesions, LE edema - none               Psychiatric: Pt behavior is normal without agitation   Micro: none  Cardiac tracings I have personally interpreted today:  none  Pertinent Radiological findings (summarize): none   Lab Results  Component Value Date   WBC 4.8 07/20/2023   HGB 15.4 07/20/2023   HCT 47.1 07/20/2023   PLT 202.0 07/20/2023   GLUCOSE 139 (H) 07/20/2023   CHOL 130 05/06/2022   TRIG 222.0 (H) 05/06/2022   HDL 47 06/17/2023   LDLDIRECT 63.0 05/06/2022   LDLCALC 77 06/17/2023   ALT 18 07/20/2023   AST 22 07/20/2023   NA 137 07/20/2023   K 3.8 07/20/2023   CL 101 07/20/2023   CREATININE 0.99 07/20/2023   BUN 11 07/20/2023   CO2 26 07/20/2023   TSH 3.04 07/20/2023   PSA 3.53 07/20/2023   HGBA1C 8.9 06/17/2023   MICROALBUR 0.9 07/20/2023   Assessment/Plan:  Joseph Walker is a 74 y.o. Black or African American [2] male with  has a past medical history of Diabetes mellitus without complication (HCC), Glaucoma, Hyperlipemia, Hypertension, and Tachycardia.  Vertigo C/w peripheral nerve left ear related likely - for meclizine  12.5 mg prn  Primary hypertension BP Readings from Last 3 Encounters:  03/30/24 132/80   07/20/23 (!) 150/90  06/23/23 132/70   Stable, pt to continue medical treatment norvasc  5 every day, hyzaar 100 25 mg every day, toprol xl 25 qd   Left otitis media Mild to mod, for antibx course doxycyline 100 bid course,  to f/u any worsening symptoms or concerns  Followup: Return if symptoms worsen or fail to improve.  Lynwood Rush, MD 03/30/2024 6:35 PM [facility] [place] [facility]     [1] No Known Allergies [2]  Current Outpatient Medications on File Prior to Visit  Medication Sig Dispense Refill   amLODipine  (NORVASC ) 5 MG tablet Take 1 tablet (5 mg total) by mouth daily. 90 tablet 0   aspirin  EC 81 MG tablet Take 1 tablet (81 mg total) by mouth daily. 90 tablet 1   atorvastatin  (LIPITOR) 40 MG tablet Take 1 tablet (40 mg total) by mouth daily. 90 tablet 1   Continuous Glucose Receiver (FREESTYLE LIBRE 2 READER) DEVI 1 Act by Does not apply route daily. 2 each 5   Continuous Glucose Sensor (FREESTYLE LIBRE 2 SENSOR) MISC Use every 14 days. 2 each 5   diltiazem (CARDIZEM CD) 240 MG 24 hr capsule Take 240 mg by mouth daily.     guaiFENesin  (MUCINEX ) 600 MG 12 hr tablet Take 2 tablets (1,200 mg total) by mouth 2 (two) times daily as needed. 60 tablet 1   insulin  glargine, 2 Unit Dial, (TOUJEO  MAX SOLOSTAR) 300 UNIT/ML Solostar Pen Inject 5-10 Units into the skin daily. 1 mL 0   Insulin  Pen Needle (BD PEN NEEDLE MICRO U/F) 32G X 6 MM MISC USE DAILY 100 each 1   losartan-hydrochlorothiazide (HYZAAR) 100-25 MG tablet Take 1 tablet by mouth daily.     metoprolol succinate (TOPROL-XL) 25 MG 24 hr tablet Take 25 mg by mouth daily.     metoprolol succinate (TOPROL-XL) 50 MG 24 hr tablet Take 50 mg by mouth daily.  OZEMPIC, 0.25 OR 0.5 MG/DOSE, 2 MG/3ML SOPN Inject 0.5 mg into the skin once a week.     XIGDUO  XR 12-998 MG TB24 TAKE 1 TABLET BY MOUTH DAILY 90 tablet 0   cetirizine  (ZYRTEC ) 10 MG tablet Take 1 tablet (10 mg total) by  mouth daily. 30 tablet 11   No current facility-administered medications on file prior to visit.

## 2024-04-11 ENCOUNTER — Telehealth: Payer: Self-pay | Admitting: Internal Medicine

## 2024-04-11 DIAGNOSIS — R42 Dizziness and giddiness: Secondary | ICD-10-CM

## 2024-04-11 NOTE — Telephone Encounter (Unsigned)
 Copied from CRM [id number]. Topic: Clinical - Medical Advice >> Apr 11, 2024 12:08 PM Eva FALCON wrote: Reason for CRM: Pt states he was wanting to let Dr. Joshua know that the Meclizine  is the 25 mg dosage not the 12.5 dosage that was sent. He also mentioned a referral to therapy that helped with his Vertigo? States he is scared that he'll have a vertigo spell and says its a horrible feeling. Wants to feel better as he has not been feeling the best. Requesting call back from Dr. Joshua or nurse.

## 2024-04-13 ENCOUNTER — Other Ambulatory Visit: Payer: Self-pay

## 2024-04-13 DIAGNOSIS — R42 Dizziness and giddiness: Secondary | ICD-10-CM

## 2024-04-13 MED ORDER — MECLIZINE HCL 25 MG PO TABS: 12.5000 mg | ORAL_TABLET | Freq: Three times a day (TID) | ORAL | 0 refills | Status: AC | PRN

## 2024-04-13 NOTE — Telephone Encounter (Signed)
 Spoke with patient, is requesting meclizine  script be fixed and requesting to go back to PT for vertigo. Originally was taking 25mg  of meclizine .

## 2024-05-05 ENCOUNTER — Ambulatory Visit: Admitting: Physical Therapy

## 2024-07-20 ENCOUNTER — Encounter: Admitting: Internal Medicine

## 2024-07-20 ENCOUNTER — Ambulatory Visit
